# Patient Record
Sex: Male | Born: 1985 | Race: Black or African American | Hispanic: No | Marital: Single | Smoking: Current every day smoker
Health system: Southern US, Community
[De-identification: ages and names within clinical notes are randomized; demographics above are authoritative.]

## PROBLEM LIST (undated history)

## (undated) DIAGNOSIS — F410 Panic disorder [episodic paroxysmal anxiety] without agoraphobia: Secondary | ICD-10-CM

## (undated) DIAGNOSIS — I10 Essential (primary) hypertension: Secondary | ICD-10-CM

## (undated) HISTORY — DX: Panic disorder (episodic paroxysmal anxiety): F41.0

## (undated) HISTORY — DX: Essential (primary) hypertension: I10

---

## 2015-11-22 ENCOUNTER — Emergency Department
Admission: EM | Admit: 2015-11-22 | Discharge: 2015-11-22 | Disposition: A | Discharge status: Home / Self Care | Payer: Self-pay | Attending: Emergency Medicine | Admitting: Emergency Medicine

## 2015-11-22 ENCOUNTER — Emergency Department: Payer: Self-pay

## 2015-11-22 DIAGNOSIS — J069 Acute upper respiratory infection, unspecified: Secondary | ICD-10-CM | POA: Insufficient documentation

## 2015-11-22 LAB — RAPID INFLUENZA A&B ANTIGENS
Influenza A (ARMC): NEGATIVE
Influenza B (ARMC): NEGATIVE

## 2015-11-22 MED ORDER — GUAIFENESIN-CODEINE 100-10 MG/5ML PO SOLN
10.0000 mL | Freq: Once | ORAL | Status: AC
  Administered 2015-11-22: 10 mL via ORAL
  Filled 2015-11-22: qty 10

## 2015-11-22 MED ORDER — IBUPROFEN 800 MG PO TABS: 800.0000 mg | ORAL_TABLET | Freq: Three times a day (TID) | ORAL | Status: DC | PRN

## 2015-11-22 MED ORDER — AZITHROMYCIN 250 MG PO TABS: ORAL_TABLET | ORAL | Status: DC

## 2015-11-22 MED ORDER — IBUPROFEN 800 MG PO TABS
800.0000 mg | ORAL_TABLET | Freq: Once | ORAL | Status: AC
  Administered 2015-11-22: 800 mg via ORAL
  Filled 2015-11-22: qty 1

## 2015-11-22 MED ORDER — AZITHROMYCIN 500 MG PO TABS
500.0000 mg | ORAL_TABLET | Freq: Once | ORAL | Status: AC
  Administered 2015-11-22: 500 mg via ORAL
  Filled 2015-11-22: qty 1

## 2015-11-22 MED ORDER — GUAIFENESIN-CODEINE 100-10 MG/5ML PO SOLN: 10.0000 mL | ORAL | Status: DC | PRN

## 2015-11-22 NOTE — ED Notes (Signed)
Pt reports 2 days of cold symptoms, runny nose and cough

## 2015-11-22 NOTE — Discharge Instructions (Signed)
Upper Respiratory Infection, Adult Most upper respiratory infections (URIs) are a viral infection of the air passages leading to the lungs. A URI affects the nose, throat, and upper air passages. The most common type of URI is nasopharyngitis and is typically referred to as "the common cold." URIs run their course and usually go away on their own. Most of the time, a URI does not require medical attention, but sometimes a bacterial infection in the upper airways can follow a viral infection. This is called a secondary infection. Sinus and middle ear infections are common types of secondary upper respiratory infections. Bacterial pneumonia can also complicate a URI. A URI can worsen asthma and chronic obstructive pulmonary disease (COPD). Sometimes, these complications can require emergency medical care and may be life threatening.  CAUSES Almost all URIs are caused by viruses. A virus is a type of germ and can spread from one person to another.  RISKS FACTORS You may be at risk for a URI if:   You smoke.   You have chronic heart or lung disease.  You have a weakened defense (immune) system.   You are very young or very old.   You have nasal allergies or asthma.  You work in crowded or poorly ventilated areas.  You work in health care facilities or schools. SIGNS AND SYMPTOMS  Symptoms typically develop 2-3 days after you come in contact with a cold virus. Most viral URIs last 7-10 days. However, viral URIs from the influenza virus (flu virus) can last 14-18 days and are typically more severe. Symptoms may include:   Runny or stuffy (congested) nose.   Sneezing.   Cough.   Sore throat.   Headache.   Fatigue.   Fever.   Loss of appetite.   Pain in your forehead, behind your eyes, and over your cheekbones (sinus pain).  Muscle aches.  DIAGNOSIS  Your health care provider may diagnose a URI by:  Physical exam.  Tests to check that your symptoms are not due to  another condition such as:  Strep throat.  Sinusitis.  Pneumonia.  Asthma. TREATMENT  A URI goes away on its own with time. It cannot be cured with medicines, but medicines may be prescribed or recommended to relieve symptoms. Medicines may help:  Reduce your fever.  Reduce your cough.  Relieve nasal congestion. HOME CARE INSTRUCTIONS   Take medicines only as directed by your health care provider.   Gargle warm saltwater or take cough drops to comfort your throat as directed by your health care provider.  Use a warm mist humidifier or inhale steam from a shower to increase air moisture. This may make it easier to breathe.  Drink enough fluid to keep your urine clear or pale yellow.   Eat soups and other clear broths and maintain good nutrition.   Rest as needed.   Return to work when your temperature has returned to normal or as your health care provider advises. You may need to stay home longer to avoid infecting others. You can also use a face mask and careful hand washing to prevent spread of the virus.  Increase the usage of your inhaler if you have asthma.   Do not use any tobacco products, including cigarettes, chewing tobacco, or electronic cigarettes. If you need help quitting, ask your health care provider. PREVENTION  The best way to protect yourself from getting a cold is to practice good hygiene.   Avoid oral or hand contact with people with cold   symptoms.   Wash your hands often if contact occurs.  There is no clear evidence that vitamin C, vitamin E, echinacea, or exercise reduces the chance of developing a cold. However, it is always recommended to get plenty of rest, exercise, and practice good nutrition.  SEEK MEDICAL CARE IF:   You are getting worse rather than better.   Your symptoms are not controlled by medicine.   You have chills.  You have worsening shortness of breath.  You have brown or red mucus.  You have yellow or brown nasal  discharge.  You have pain in your face, especially when you bend forward.  You have a fever.  You have swollen neck glands.  You have pain while swallowing.  You have white areas in the back of your throat. SEEK IMMEDIATE MEDICAL CARE IF:   You have severe or persistent:  Headache.  Ear pain.  Sinus pain.  Chest pain.  You have chronic lung disease and any of the following:  Wheezing.  Prolonged cough.  Coughing up blood.  A change in your usual mucus.  You have a stiff neck.  You have changes in your:  Vision.  Hearing.  Thinking.  Mood. MAKE SURE YOU:   Understand these instructions.  Will watch your condition.  Will get help right away if you are not doing well or get worse.   This information is not intended to replace advice given to you by your health care provider. Make sure you discuss any questions you have with your health care provider.   Document Released: 02/26/2001 Document Revised: 01/17/2015 Document Reviewed: 12/08/2013 Elsevier Interactive Patient Education 2016 [employer]  

## 2015-11-22 NOTE — ED Provider Notes (Signed)
[facility] Emergency Department Provider Note  ____________________________________________  Time seen: Approximately 9:26 PM  I have reviewed the triage vital signs and the nursing notes.   HISTORY  Chief Complaint URI    HPI Kurt Dean is a 30 y.o. male who presents with a 2 day history of fever chills runny nose congestion sore throat and cough. States symptoms started slowly and then came on suddenly. States no relief with over-the-counter medications. Worse when standing up and down with the public. States he works at [employer] and has been around a lot of sick people lately.   No past medical history on file.  There are no active problems to display for this patient.   No past surgical history on file.  Current Outpatient Rx  Name  Route  Sig  Dispense  Refill  . azithromycin (ZITHROMAX Z-PAK) 250 MG tablet      Take 2 tablets (500 mg) on  Day 1,  followed by 1 tablet (250 mg) once daily on Days 2 through 5.   6 each   0   . guaiFENesin-codeine 100-10 MG/5ML syrup   Oral   Take 10 mLs by mouth every 4 (four) hours as needed for cough.   180 mL   0   . ibuprofen (ADVIL,MOTRIN) 800 MG tablet   Oral   Take 1 tablet (800 mg total) by mouth every 8 (eight) hours as needed.   30 tablet   0     Allergies Review of patient's allergies indicates no known allergies.  No family history on file.  Social History Social History  Substance Use Topics  . Smoking status: Not on file  . Smokeless tobacco: Not on file  . Alcohol Use: Not on file    Review of Systems Constitutional: Positive fever and chills Eyes: No visual changes. ENT: Positive sore throat. Cardiovascular: Denies chest pain. Respiratory: Denies shortness of breath. Positive for cough Musculoskeletal: Negative for back pain. Skin: Negative for rash. Neurological: Positive for headaches, negative for focal weakness or numbness.  10-point ROS otherwise  negative.  ____________________________________________   PHYSICAL EXAM:  VITAL SIGNS: ED Triage Vitals  Enc Vitals Group     BP 11/22/15 1956 149/94 mmHg     Pulse Rate 11/22/15 1956 110     Resp 11/22/15 1956 18     Temp 11/22/15 1956 98.5 F (36.9 C)     Temp src --      SpO2 11/22/15 1956 100 %     Weight --      Height --      Head Cir --      Peak Flow --      Pain Score 11/22/15 1956 9     Pain Loc --      Pain Edu? --      Excl. in GC? --     Constitutional: Alert and oriented. Well appearing and in no acute distress. Head: Atraumatic. Nose: No congestion/rhinnorhea. Mouth/Throat: Mucous membranes are moist.  Oropharynx erythematous. Neck: No stridor.   Cardiovascular: Normal rate, regular rhythm. Grossly normal heart sounds.  Good peripheral circulation. Respiratoretal: Normal respiratory effort.  No retractions. Lungs CTAB. Neurologic:  Normal speech and language. No gross focal neurologic deficits are appreciated. No gait instability. Skin:  Skin is warm, dry and intact. No rash noted. Psychiatric: Mood and affect are normal. Speech and behavior are normal.  ____________________________________________   LABS (all labs ordered are listed, but only abnormal results are displayed)  Labs Reviewed  RAPID INFLUENZA A&B ANTIGENS ([facility] ONLY)   ____________________________________________  EKG   ____________________________________________  RADIOLOGY  No acute cardiopulmonary findings. ____________________________________________   PROCEDURES  Procedure(s) performed: None  Critical Care performed: No  ____________________________________________   INITIAL IMPRESSION / ASSESSMENT AND PLAN / ED COURSE  Pertinent labs & imaging results that were available during my care of the patient were reviewed by me and considered in my medical decision making (see chart for details).  Acute upper respiratory infection/pharyngitis. Rx given for Zithromax,  Robitussin-AC and Motrin 800 mg. Patient to follow-up with PCP or return here with any worsening symptomology. She is 48 hours given. ____________________________________________   FINAL CLINICAL IMPRESSION(S) / ED DIAGNOSES  Final diagnoses:  URI, acute     This chart was dictated using voice recognition software/Dragon. Despite best efforts to proofread, errors can occur which can change the meaning. Any change was purely unintentional.   Evangeline Dakinharles M Klarisa Barman, PA-C 11/22/15 2140  Jeanmarie PlantJames A McShane, MD 11/23/15 [phone]

## 2016-12-19 ENCOUNTER — Emergency Department
Admission: EM | Admit: 2016-12-19 | Discharge: 2016-12-19 | Disposition: A | Discharge status: Home / Self Care | Payer: Self-pay | Attending: Emergency Medicine | Admitting: Emergency Medicine

## 2016-12-19 DIAGNOSIS — K4090 Unilateral inguinal hernia, without obstruction or gangrene, not specified as recurrent: Secondary | ICD-10-CM | POA: Insufficient documentation

## 2016-12-19 NOTE — ED Triage Notes (Signed)
Pt in via POV with complaints of inguinal hernia to left groin.  Pt reports hx of same but with new onset "pressure and bulging" within the last week while working.  Pt denies any pain at this time.  Pt ambulatory to triage room without any difficulty.

## 2016-12-19 NOTE — Discharge Instructions (Signed)
Call and make an appointment with the surgeon listed on your discharge papers.

## 2016-12-19 NOTE — ED Notes (Signed)
See triage note  States he was dx'd with hernia couple of years ago  But was told at that time he did not need surgery ..but over the past few weeks he has had increased pain to left groin area  Denies any pain at present  Ambulates well to treatment room

## 2016-12-19 NOTE — ED Provider Notes (Signed)
[facility] Emergency Department Provider Note  ____________________________________________   None    (approximate)  I have reviewed the triage vital signs and the nursing notes.   HISTORY  Chief Complaint Inguinal Hernia    HPI Kurt Dean is a 31 y.o. male is here with complaint of left inguinal hernia. Patient states that he has been incarcerated twice and both times his  doctor has told him he has an inguinal hernia.  This is been ongoing for the last 5 years or longer.   He has not followed up with a surgeon at this time. Patient states that he has more discomfort with lifting then with standing. Patient states that the hernia is still continues to slide in and out. He denies any pain or discomfort at this time. There is been no nausea or vomiting.  No past medical history on file.  There are no active problems to display for this patient.   No past surgical history on file.  Prior to Admission medications   Not on File    Allergies Patient has no known allergies.  No family history on file.  Social History Social History  Substance Use Topics  . Smoking status: Not on file  . Smokeless tobacco: Not on file  . Alcohol use Not on file    Review of Systems Constitutional: No fever/chills Eyes: No visual changes. ENT: No sore throat. Cardiovascular: Denies chest pain. Respiratory: Denies shortness of breath. Gastrointestinal: No abdominal pain.  No nausea, no vomiting.   Genitourinary: History of left inguinal hernia. Musculoskeletal: Negative for back pain. Skin: Negative for rash. Neurological: Negative for headaches, focal weakness or numbness.  10-point ROS otherwise negative.  ____________________________________________   PHYSICAL EXAM:  VITAL SIGNS: ED Triage Vitals [12/19/16 0727]  Enc Vitals Group     BP (!) 142/90     Pulse Rate 80     Resp 18     Temp 97.9 F (36.6 C)     Temp Source Oral     SpO2  100 %     Weight 145 lb (65.8 kg)     Height  (1.753 m)     Head Circumference      Peak Flow      Pain Score      Pain Loc      Pain Edu?      Excl. in GC?     Constitutional: Alert and oriented. Well appearing and in no acute distress. Eyes: Conjunctivae are normal. PERRL. EOMI. Head: Atraumatic. Nose: No congestion/rhinnorhea. Neck: No stridor.   Cardiovascular: Normal rate, regular rhythm. Grossly normal heart sounds.  Good peripheral circulation. Respiratory: Normal respiratory effort.  No retractions. Lungs CTAB. Gastrointestinal: Soft and nontender. No distention. Genitourinary:  Patient positive for left inguinal hernia. Hernia is reducible. Nontender. Musculoskeletal: No lower extremity tenderness nor edema.  No joint effusions. Neurologic:  Normal speech and language. No gross focal neurologic deficits are appreciated. No gait instability. Skin:  Skin is warm, dry and intact. No rash noted. Psychiatric: Mood and affect are normal. Speech and behavior are normal.  ____________________________________________   LABS (all labs ordered are listed, but only abnormal results are displayed)  Labs Reviewed - No data to display   PROCEDURES  Procedure(s) performed: None  Procedures  Critical Care performed: No  ____________________________________________   INITIAL IMPRESSION / ASSESSMENT AND PLAN / ED COURSE  Pertinent labs & imaging results that were available during my care of the patient  were reviewed by me and considered in my medical decision making (see chart for details).  Patient was given him the name of the surgeon on call today. He is call make an appointment for surgical repair. He is given a note for today explaining where he was.      ____________________________________________   FINAL CLINICAL IMPRESSION(S) / ED DIAGNOSES  Final diagnoses:  Left inguinal hernia      NEW MEDICATIONS STARTED DURING THIS VISIT:  Discharge  Medication List as of 12/19/2016  8:28 AM       Note:  This document was prepared using Dragon voice recognition software and may include unintentional dictation errors.    Tommi Rumps, PA-C 12/19/16 1127    Merrily Brittle, MD 12/19/16 [phone]

## 2017-08-15 ENCOUNTER — Emergency Department: Payer: Self-pay

## 2017-08-15 ENCOUNTER — Emergency Department
Admission: EM | Admit: 2017-08-15 | Discharge: 2017-08-15 | Disposition: A | Discharge status: Home / Self Care | Payer: Self-pay | Attending: Emergency Medicine | Admitting: Emergency Medicine

## 2017-08-15 DIAGNOSIS — B070 Plantar wart: Secondary | ICD-10-CM | POA: Insufficient documentation

## 2017-08-15 DIAGNOSIS — F17210 Nicotine dependence, cigarettes, uncomplicated: Secondary | ICD-10-CM | POA: Insufficient documentation

## 2017-08-15 MED ORDER — SALICYLIC ACID 27.5 % EX LIQD: 1.0000 "application " | Freq: Two times a day (BID) | CUTANEOUS | 2 refills | Status: AC

## 2017-08-15 MED ORDER — MELOXICAM 15 MG PO TABS: 15.0000 mg | ORAL_TABLET | Freq: Every day | ORAL | 0 refills | Status: DC

## 2017-08-15 NOTE — ED Triage Notes (Signed)
Pt in via POV with complaints of pain to right great toe x months, denies any recent injury, states, "I think its from work and wearing steel toe boots."  Pt ambulatory to triage without difficulty, NAD noted at this time.

## 2017-08-15 NOTE — ED Notes (Signed)
Triage performed per this RN. 

## 2017-08-15 NOTE — ED Provider Notes (Signed)
[facility] Emergency Department Provider Note  ____________________________________________  Time seen: Approximately 5:37 PM  I have reviewed the triage vital signs and the nursing notes.   HISTORY  Chief Complaint Foot Pain    HPI Kurt Dean is a 31 y.o. male who presents emergency department complaining of pain to the right great toe and right foot.  Patient reports that he wears steel toed boots for his job.  Patient reports that his toe rubs against the steel toe aspect and sometimes gets some issues.  Over the past 5 months, patient has noticed an area to the plantar aspect of the great toe over the pad that has changed in appearance.  Patient reports that initially it looked like a blister versus callus.  He has been shaving it with a razor blade.  He now notes a "core" to the area.  Over the past week to 2 weeks, patient has developed sharp intermittent pains radiating from his toe into his foot.  Patient denies any recent trauma.  Patient reports that the area has been growing in size.  He denies any gross erythema.  No drainage from the site.  No other complaints at this time.  Over the counter medications have not provided any relief.  History reviewed. No pertinent past medical history.  There are no active problems to display for this patient.   History reviewed. No pertinent surgical history.  Prior to Admission medications   Medication Sig Start Date End Date Taking? Authorizing Provider  meloxicam (MOBIC) 15 MG tablet Take 1 tablet (15 mg total) by mouth daily. 08/15/17   Cuthriell, Delorise RoyalsJonathan D, PA-C  Salicylic Acid 27.5 % LIQD Apply 1 application topically 2 (two) times daily. Apply for 4-6 weeks until area shows complete resolution 08/15/17 09/26/17  Cuthriell, Delorise RoyalsJonathan D, PA-C    Allergies Patient has no known allergies.  No family history on file.  Social History Social History   Tobacco Use  . Smoking status: Current Every  Day Smoker    Packs/day: 0.50    Types: Cigarettes  . Smokeless tobacco: Never Used  Substance Use Topics  . Alcohol use: Not on file  . Drug use: No     Review of Systems  Constitutional: No fever/chills Eyes: No visual changes. Cardiovascular: no chest pain. Respiratory: no cough. No SOB. Gastrointestinal: No abdominal pain.  No nausea, no vomiting.  Musculoskeletal: Pain in the right great toe and foot. Skin: Negative for rash, abrasions, lacerations, ecchymosis. Neurological: Negative for headaches, focal weakness or numbness. 10-point ROS otherwise negative.  ____________________________________________   PHYSICAL EXAM:  VITAL SIGNS: ED Triage Vitals  Enc Vitals Group     BP 08/15/17 1549 (!) 163/102     Pulse Rate 08/15/17 1549 83     Resp 08/15/17 1549 16     Temp 08/15/17 1549 98.2 F (36.8 C)     Temp Source 08/15/17 1549 Oral     SpO2 08/15/17 1549 100 %     Weight 08/15/17 1549 145 lb (65.8 kg)     Height 08/15/17 1549 5\' 9"  (1.753 m)     Head Circumference --      Peak Flow --      Pain Score 08/15/17 1548 6     Pain Loc --      Pain Edu? --      Excl. in GC? --      Constitutional: Alert and oriented. Well appearing and in no acute distress. Eyes: Conjunctivae are  normal. PERRL. EOMI. Head: Atraumatic. Neck: No stridor.    Cardiovascular: Normal rate, regular rhythm. Normal S1 and S2.  Good peripheral circulation. Respiratory: Normal respiratory effort without tachypnea or retractions. Lungs CTAB. Good air entry to the bases with no decreased or absent breath sounds. Musculoskeletal: Full range of motion to all extremities. No gross deformities appreciated.  No gross deformities noted to the right foot.  Patient has skin changes to the plantar aspect of the great toe.  Central core with surrounding hypertrophy of the skin consistent with plantar wart.  Area is tender to palpation.  Patient is nontender palpation over the foot.  Dorsalis pedis pulse  intact.  Sensation and cap refill intact all 5 digits. Neurologic:  Normal speech and language. No gross focal neurologic deficits are appreciated.  Skin:  Skin is warm, dry and intact. No rash noted. Psychiatric: Mood and affect are normal. Speech and behavior are normal. Patient exhibits appropriate insight and judgement.   ____________________________________________   LABS (all labs ordered are listed, but only abnormal results are displayed)  Labs Reviewed - No data to display ____________________________________________  EKG   ____________________________________________  RADIOLOGY Festus Barren Cuthriell, personally viewed and evaluated these images (plain radiographs) as part of my medical decision making, as well as reviewing the written report by the radiologist.  Dg Foot Complete Right  Result Date: 08/15/2017 CLINICAL DATA:  Callus on the bottom of the foot, enlarging. EXAM: RIGHT FOOT COMPLETE - 3+ VIEW COMPARISON:  None. FINDINGS: There is no evidence of fracture or dislocation. There is no evidence of arthropathy or other focal bone abnormality. Increased soft tissue swelling about the great toe. IMPRESSION: No acute osseous findings.  Soft tissue swelling. Electronically Signed   By: Elsie Stain M.D.   On: 08/15/2017 16:38    ____________________________________________    PROCEDURES  Procedure(s) performed:    Procedures    Medications - No data to display   ____________________________________________   INITIAL IMPRESSION / ASSESSMENT AND PLAN / ED COURSE  Pertinent labs & imaging results that were available during my care of the patient were reviewed by me and considered in my medical decision making (see chart for details).  Review of the  [place] was performed in accordance of the [facility] prior to dispensing any controlled drugs.     Patient's diagnosis is consistent with plantar wart to her the right foot.  Patient presents with pain and  skin changes to the first digit of the right foot.  Differential included gout versus wart versus cellulitis versus osteomyelitis.  X-ray was obtained to ensure no osseous changes.  X-ray reveals no acute osseous ab normality.  Patient will be discharged home with prescriptions for salicylic acid topical and meloxicam. Patient is to follow up with podiatry as needed or otherwise directed. Patient is given ED precautions to return to the ED for any worsening or new symptoms.     ____________________________________________  FINAL CLINICAL IMPRESSION(S) / ED DIAGNOSES  Final diagnoses:  Plantar wart      NEW MEDICATIONS STARTED DURING THIS VISIT:  ED Discharge Orders        Ordered    Salicylic Acid 27.5 % LIQD  2 times daily     08/15/17 1748    meloxicam (MOBIC) 15 MG tablet  Daily     08/15/17 1748          This chart was dictated using voice recognition software/Dragon. Despite best efforts to proofread, errors can occur which can change  the meaning. Any change was purely unintentional.    Racheal PatchesCuthriell, Jonathan D, PA-C 08/15/17 1757    Arnaldo NatalMalinda, Paul F, MD 08/15/17 [phone]

## 2017-10-22 ENCOUNTER — Ambulatory Visit (INDEPENDENT_AMBULATORY_CARE_PROVIDER_SITE_OTHER): Payer: Self-pay | Admitting: Family Medicine

## 2017-10-22 ENCOUNTER — Encounter: Payer: Self-pay | Admitting: Family Medicine

## 2017-10-22 VITALS — BP 138/79 | HR 80 | Temp 97.8°F | Ht 68.75 in | Wt 143.1 lb

## 2017-10-22 DIAGNOSIS — K4090 Unilateral inguinal hernia, without obstruction or gangrene, not specified as recurrent: Secondary | ICD-10-CM

## 2017-10-22 DIAGNOSIS — Z7689 Persons encountering health services in other specified circumstances: Secondary | ICD-10-CM

## 2017-10-22 DIAGNOSIS — B070 Plantar wart: Secondary | ICD-10-CM

## 2017-10-22 NOTE — Progress Notes (Signed)
BP 138/79 (BP Location: Right Arm, Patient Position: Sitting, Cuff Size: Normal)   Pulse 80   Temp 97.8 F (36.6 C) (Oral)   Ht 5' 8.75" (1.746 m)   Wt 143 lb 1.6 oz (64.9 kg)   SpO2 100%   BMI 21.29 kg/m    Subjective:    Patient ID: Kurt [facility], male    DOB: September 24, 1985, 32 y.o.   MRN: [id number]  HPI: Kurt Gaussatrick Lee Boze is a 32 y.o. male  Chief Complaint  Patient presents with  . Establish Care  . Foot Pain    Right, ongoing for the last few months.   Pt here today to establish care.   Main concern today is right foot pain at big toe x 1 year. Has tried soaks, shaving the dead skin, ointments. Sxs worsening and now having sharp pains from that area across entire foot. Found out it was a plantar wart, started using topical acids and wart pads. Nothing helped and kept getting worse, went to ER in Nov and was told to use meloxicam and stronger salycylic acid. Has continued OTC care with no relief.   Diagnosed with a hernia in left groin while incarcerated. Reducible, only painful during heavy lifting at work. No redness, fevers, severe pain. Does not bother him much at all.   Long fhx of HTN and strokes on mother's side. Had a lot of stressors in 2009 time frame and was having intermittent issues with HAs and visual disturbances from elevated BPs. Has not had issues since.   Smoking cigarettes - about 3-4 cigarettes per day. Trying to reduce slowly.   Last CPE was around 2015 while incarcerated.   Relevant past medical, surgical, family and social history reviewed and updated as indicated. Interim medical history since our last visit reviewed. Allergies and medications reviewed and updated.  Review of Systems  Constitutional: Negative.   HENT: Negative.   Respiratory: Negative.   Cardiovascular: Negative.   Gastrointestinal:       Left inguinal hernia  Genitourinary: Negative.   Musculoskeletal: Negative.   Skin:       Significant plantar wart    Neurological: Negative.   Psychiatric/Behavioral: Negative.    Per HPI unless specifically indicated above     Objective:    BP 138/79 (BP Location: Right Arm, Patient Position: Sitting, Cuff Size: Normal)   Pulse 80   Temp 97.8 F (36.6 C) (Oral)   Ht 5' 8.75" (1.746 m)   Wt 143 lb 1.6 oz (64.9 kg)   SpO2 100%   BMI 21.29 kg/m   Wt Readings from Last 3 Encounters:  10/22/17 143 lb 1.6 oz (64.9 kg)  08/15/17 145 lb (65.8 kg)  12/19/16 145 lb (65.8 kg)    Physical Exam  Constitutional: He is oriented to person, place, and time. He appears well-developed and well-nourished. No distress.  HENT:  Head: Atraumatic.  Eyes: Conjunctivae are normal. Pupils are equal, round, and reactive to light.  Neck: Normal range of motion. Neck supple.  Cardiovascular: Normal rate and normal heart sounds.  Pulmonary/Chest: Effort normal and breath sounds normal. No respiratory distress.  Abdominal: Soft. Bowel sounds are normal. He exhibits no distension. There is no tenderness. A hernia is present. Hernia confirmed positive in the left inguinal area (reducible).  Musculoskeletal: Normal range of motion.  Neurological: He is alert and oriented to person, place, and time.  Skin: Skin is warm and dry.  Significant callous and plantar wart of right great toe  Psychiatric: He has a normal mood and affect. His behavior is normal.  Nursing note and vitals reviewed.  Results for orders placed or performed during the hospital encounter of 11/22/15  Rapid Influenza A&B Antigens ([facility] only)  Result Value Ref Range   Influenza A ([facility]) NEGATIVE NEGATIVE   Influenza B ([facility]) NEGATIVE NEGATIVE      Assessment & Plan:   Problem List Items Addressed This Visit    None    Visit Diagnoses    Plantar wart of right foot    -  Primary   Podiatry referral generated given extent and OTC tx failure. Continue soaks and callous management at home in meantime. OTC pain relievers prn   Relevant Orders    Ambulatory referral to Podiatry   Encounter to establish care       Left inguinal hernia       Dx'ed while incarcerated. Minimally bothersome. Warning precautions reviewed at length, pt will let us know when he's ready for surgical consult       Follow up plan: Return for CPE.

## 2017-10-22 NOTE — Patient Instructions (Signed)
Follow up for CPE 

## 2017-11-07 ENCOUNTER — Ambulatory Visit (INDEPENDENT_AMBULATORY_CARE_PROVIDER_SITE_OTHER): Payer: BLUE CROSS/BLUE SHIELD | Admitting: Podiatry

## 2017-11-07 ENCOUNTER — Encounter: Payer: Self-pay | Admitting: Podiatry

## 2017-11-07 DIAGNOSIS — B070 Plantar wart: Secondary | ICD-10-CM | POA: Diagnosis not present

## 2017-11-07 MED ORDER — TRAMADOL HCL 50 MG PO TABS: 50.0000 mg | ORAL_TABLET | Freq: Three times a day (TID) | ORAL | 0 refills | Status: DC | PRN

## 2017-11-07 NOTE — Progress Notes (Signed)
   Subjective:    Patient ID: Kurt Dean, male    DOB: [birth year], 32 y.o.   MRN: [id number]  HPI    Review of Systems  All other systems reviewed and are negative.      Objective:   Physical Exam        Assessment & Plan:

## 2017-11-09 NOTE — Progress Notes (Signed)
   Subjective: 32 year old male presenting today as a new patient with a chief complaint of a skin lesion to the right great toe that has been present for about 9-12 months. He reports associated pain with pressure. He has not done anything to treat the area. Patient is here for further evaluation and treatment.   Past Medical History:  Diagnosis Date  . Hypertension   . Panic attacks     Objective: Physical Exam General: The patient is alert and oriented x3 in no acute distress.  Dermatology: Hyperkeratotic skin lesion noted to the plantar aspect of the right foot approximately 1 cm in diameter. Pinpoint bleeding noted upon debridement. Skin is warm, dry and supple bilateral lower extremities. Negative for open lesions or macerations.  Vascular: Palpable pedal pulses bilaterally. No edema or erythema noted. Capillary refill within normal limits.  Neurological: Epicritic and protective threshold grossly intact bilaterally.   Musculoskeletal Exam: Pain on palpation to the note skin lesion.  Range of motion within normal limits to all pedal and ankle joints bilateral. Muscle strength 5/5 in all groups bilateral.   Assessment: #1 plantar wart right great toe  Plan of Care:  #1 Patient was evaluated. #2 Excisional debridement of the plantar wart lesion was performed using a chisel blade. Cantharone was applied and the lesion was dressed with a dry sterile dressing. #3 Prescription for tramadol provided to patient. #4 patient is to return to clinic in 2 weeks.  Works in [employer].    Felecia ShellingBrent M. Genevra Orne, DPM [facility]  Dr. Felecia ShellingBrent M. Dashana Guizar, DPM    [address]                                        [place], [place] [address]                Office [phone]) [phone]  Fax [phone]) [phone]

## 2017-11-21 ENCOUNTER — Encounter: Payer: Self-pay | Admitting: Podiatry

## 2017-11-21 ENCOUNTER — Ambulatory Visit (INDEPENDENT_AMBULATORY_CARE_PROVIDER_SITE_OTHER): Payer: BLUE CROSS/BLUE SHIELD | Admitting: Podiatry

## 2017-11-21 DIAGNOSIS — B070 Plantar wart: Secondary | ICD-10-CM | POA: Diagnosis not present

## 2017-11-24 NOTE — Progress Notes (Signed)
   HPI:   Past Medical History:  Diagnosis Date  . Hypertension   . Panic attacks      Physical Exam: General: The patient is alert and oriented x3 in no acute distress.  Dermatology: Hyperkeratotic callus lesion noted to the right great toe. Skin is warm, dry and supple bilateral lower extremities. Negative for open lesions or macerations.  Vascular: Palpable pedal pulses bilaterally. No edema or erythema noted. Capillary refill within normal limits.  Neurological: Epicritic and protective threshold grossly intact bilaterally.   Musculoskeletal Exam: Range of motion within normal limits to all pedal and ankle joints bilateral. Muscle strength 5/5 in all groups bilateral.    Assessment: - right great toe wart - resolved   Plan of Care:  - Patient evaluated.  - Excisional debridement of keratoic lesion using a chisel blade was performed without incident.  - Dressed with light dressing. - Return to clinic as needed.     Felecia ShellingBrent M. Evans, DPM [facility]  Dr. Felecia ShellingBrent M. Evans, DPM    [address]                                        [place], [place] [address]                Office [phone]) [phone]  Fax [phone](336) [phone]

## 2017-12-09 ENCOUNTER — Encounter: Payer: Self-pay | Admitting: Family Medicine

## 2018-04-06 IMAGING — DX DG FOOT COMPLETE 3+V*R*
3 series · 3 of 3 positions shown · non-contrast
Comparison: None.

CLINICAL DATA: Callus on the bottom of the foot, enlarging.

EXAM:
RIGHT FOOT COMPLETE - 3+ VIEW

[foot ap]
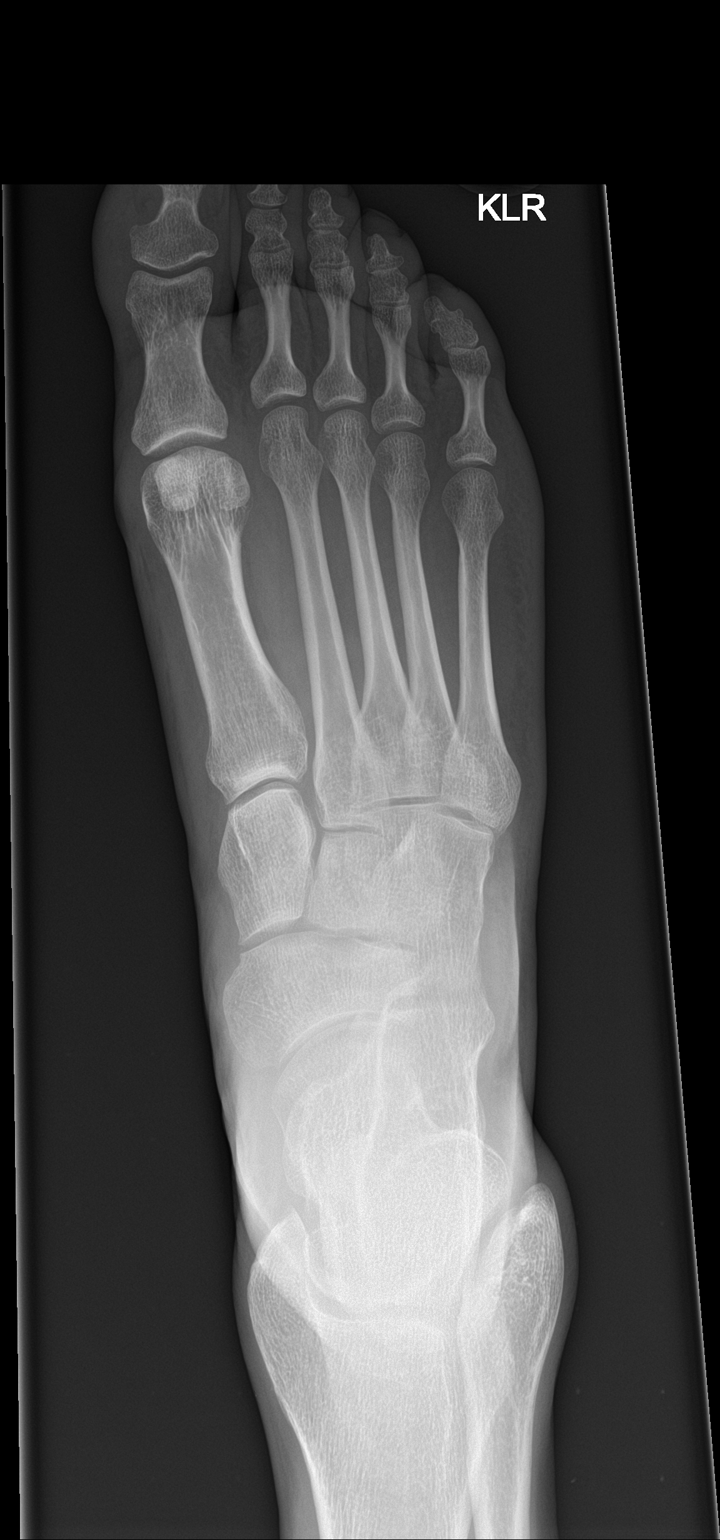

[foot obl]
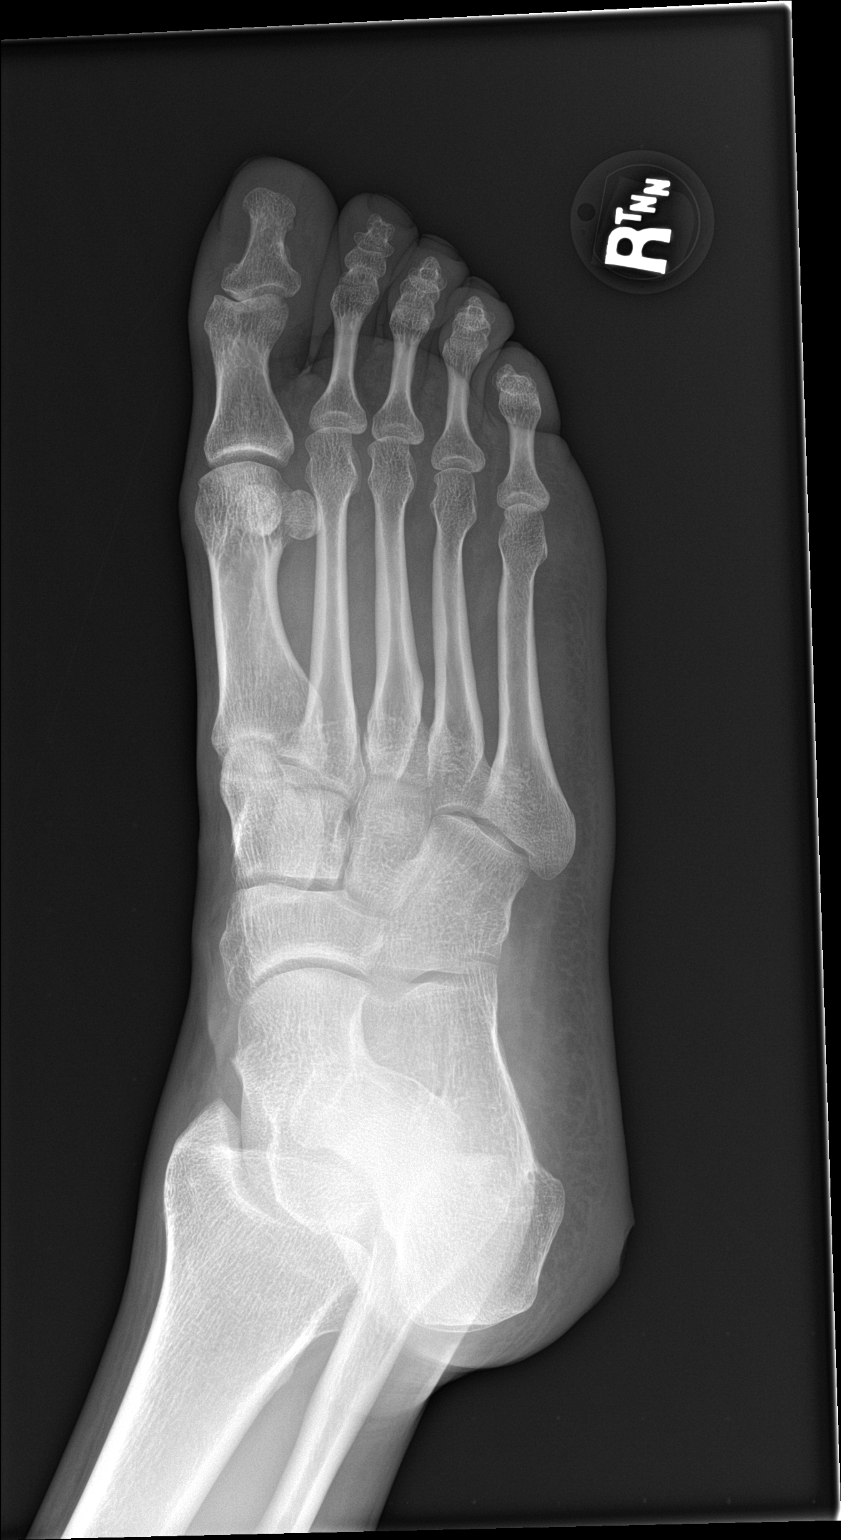

[foot lat]
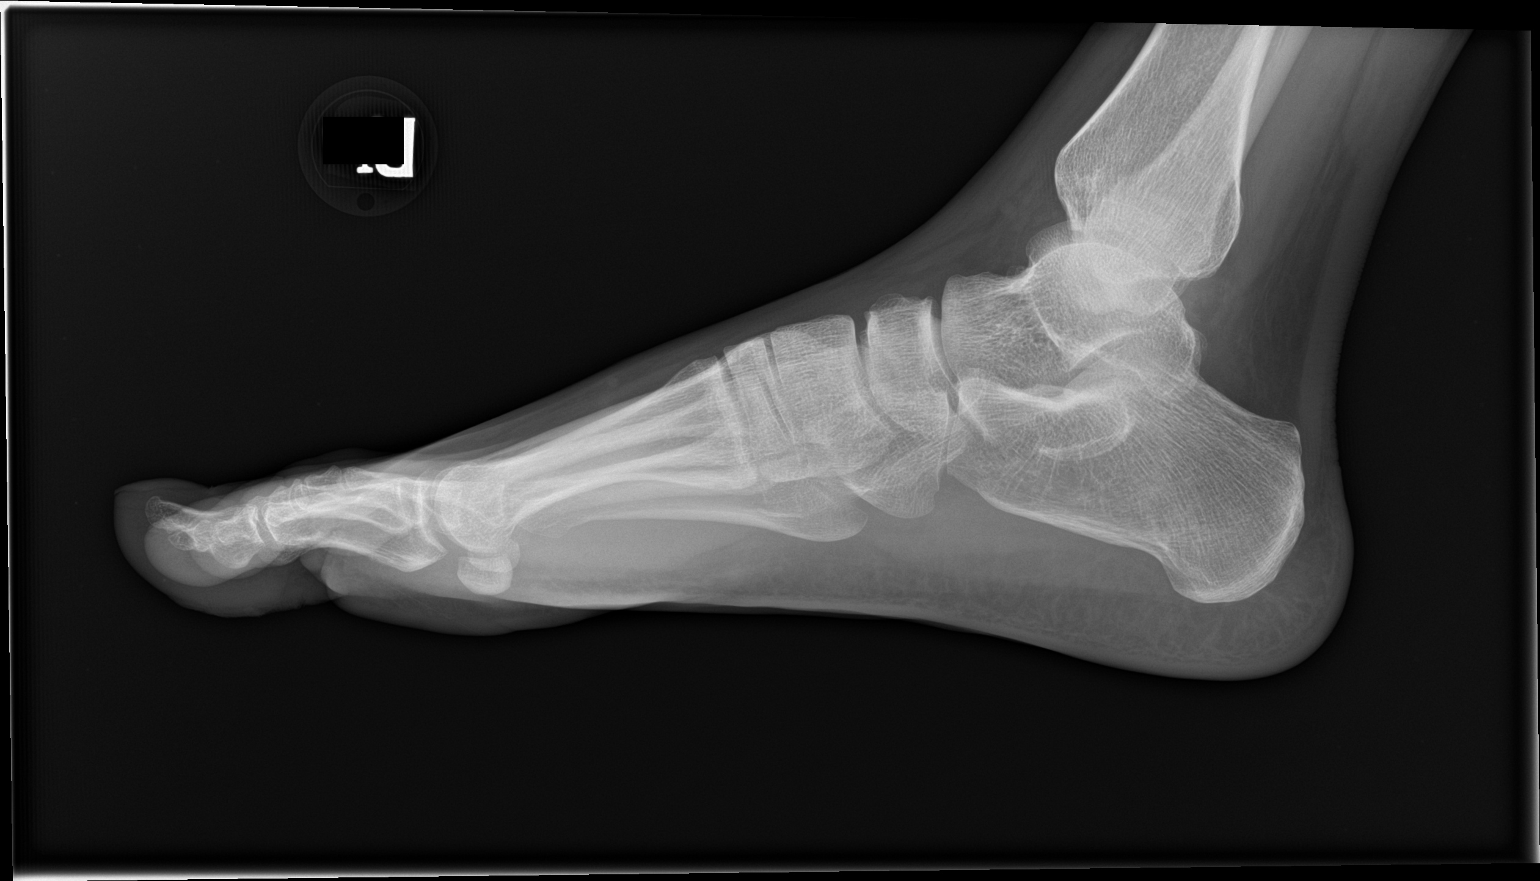

[3 of 3 positions shown; findings below may reference images not displayed]

FINDINGS: There is no evidence of fracture or dislocation. There is no
evidence of arthropathy or other focal bone abnormality. Increased
soft tissue swelling about the great toe.
IMPRESSION: No acute osseous findings.  Soft tissue swelling.

## 2019-07-18 ENCOUNTER — Encounter: Payer: Self-pay | Admitting: Emergency Medicine

## 2019-07-18 ENCOUNTER — Emergency Department
Admission: EM | Admit: 2019-07-18 | Discharge: 2019-07-18 | Disposition: A | Discharge status: Home / Self Care | Payer: Self-pay | Attending: Emergency Medicine | Admitting: Emergency Medicine

## 2019-07-18 ENCOUNTER — Emergency Department: Payer: Self-pay

## 2019-07-18 DIAGNOSIS — Y999 Unspecified external cause status: Secondary | ICD-10-CM | POA: Diagnosis not present

## 2019-07-18 DIAGNOSIS — S60221A Contusion of right hand, initial encounter: Secondary | ICD-10-CM | POA: Insufficient documentation

## 2019-07-18 DIAGNOSIS — F17210 Nicotine dependence, cigarettes, uncomplicated: Secondary | ICD-10-CM | POA: Diagnosis not present

## 2019-07-18 DIAGNOSIS — S6991XA Unspecified injury of right wrist, hand and finger(s), initial encounter: Secondary | ICD-10-CM | POA: Diagnosis present

## 2019-07-18 DIAGNOSIS — Z791 Long term (current) use of non-steroidal anti-inflammatories (NSAID): Secondary | ICD-10-CM | POA: Insufficient documentation

## 2019-07-18 DIAGNOSIS — I10 Essential (primary) hypertension: Secondary | ICD-10-CM | POA: Diagnosis not present

## 2019-07-18 DIAGNOSIS — W25XXXA Contact with sharp glass, initial encounter: Secondary | ICD-10-CM | POA: Insufficient documentation

## 2019-07-18 DIAGNOSIS — S60511A Abrasion of right hand, initial encounter: Secondary | ICD-10-CM | POA: Insufficient documentation

## 2019-07-18 DIAGNOSIS — Y939 Activity, unspecified: Secondary | ICD-10-CM | POA: Diagnosis not present

## 2019-07-18 DIAGNOSIS — Y929 Unspecified place or not applicable: Secondary | ICD-10-CM | POA: Diagnosis not present

## 2019-07-18 MED ORDER — BACITRACIN-NEOMYCIN-POLYMYXIN 400-5-5000 EX OINT
TOPICAL_OINTMENT | CUTANEOUS | Status: AC
  Filled 2019-07-18: qty 1
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
## 2019-07-18 NOTE — ED Notes (Signed)
Pt verbalizes understanding of d/c instructions and follow up. 

## 2019-09-22 ENCOUNTER — Other Ambulatory Visit: Payer: Self-pay

## 2019-09-22 ENCOUNTER — Emergency Department (HOSPITAL_COMMUNITY)
Admission: EM | Admit: 2019-09-22 | Discharge: 2019-09-22 | Disposition: A | Discharge status: Home / Self Care | Payer: HRSA Program | Attending: Emergency Medicine | Admitting: Emergency Medicine

## 2019-09-22 DIAGNOSIS — R05 Cough: Secondary | ICD-10-CM | POA: Diagnosis present

## 2019-09-22 DIAGNOSIS — F17210 Nicotine dependence, cigarettes, uncomplicated: Secondary | ICD-10-CM | POA: Diagnosis not present

## 2019-09-22 DIAGNOSIS — Z79899 Other long term (current) drug therapy: Secondary | ICD-10-CM | POA: Diagnosis not present

## 2019-09-22 DIAGNOSIS — I10 Essential (primary) hypertension: Secondary | ICD-10-CM | POA: Diagnosis not present

## 2019-09-22 DIAGNOSIS — R070 Pain in throat: Secondary | ICD-10-CM | POA: Insufficient documentation

## 2019-09-22 DIAGNOSIS — Z20822 Contact with and (suspected) exposure to covid-19: Secondary | ICD-10-CM

## 2019-09-22 LAB — POC SARS CORONAVIRUS 2 AG -  ED: SARS Coronavirus 2 Ag: NEGATIVE

## 2019-09-22 MED ORDER — GUAIFENESIN 200 MG PO TABS: 200.0000 mg | ORAL_TABLET | ORAL | 0 refills | Status: DC | PRN

## 2019-09-22 NOTE — ED Triage Notes (Signed)
Onset 2 weeks cough, sneezing, runny nose, shortness of breath.  Pt talking in complete sentences, NAD at triage.  Nephew tested COVID + 2 days ago, close contact.

## 2019-09-22 NOTE — ED Notes (Signed)
RN went over dc paperwork with pt who verbalized understanding. Pt alert and no distress noted when ambulated to exit.

## 2019-09-22 NOTE — ED Provider Notes (Signed)
[facility] Provider Note   CSN: [id number] Arrival date & time: 09/22/19  1710     History Chief Complaint  Patient presents with  . Cough    Kurt Dean is a 34 y.o. male.  The history is provided by the patient. No language interpreter was used.  Cough    34 year old male presenting with concern of COVID-19.  Patient report for the past 1 to 2 weeks he has had sinus congestion, throat irritation, occasional nonproductive cough, and feeling a little bit under the weather.  He was notified that his nephew who he spent a moderate amount of time with has been tested positive for COVID-19 2 days ago.  Would like to be tested.  He denies any significant fever, no shortness of breath, no loss of taste or smell, no nausea vomiting or diarrhea.  He does work at [employer] and comes in contact with a lot of people.  He is a smoker, with history of hypertension.  He denies any specific treatment tried at home.  Past Medical History:  Diagnosis Date  . Hypertension   . Panic attacks     There are no problems to display for this patient.   No past surgical history on file.     Family History  Problem Relation Age of Onset  . Hypertension Mother   . Stroke Mother   . Hypertension Father   . Cancer Father   . Hypertension Sister   . Depression Sister   . Stroke Maternal Grandmother   . Cancer Paternal Grandmother     Social History   Tobacco Use  . Smoking status: Current Every Day Smoker    Packs/day: 0.50    Types: Cigarettes  . Smokeless tobacco: Never Used  Substance Use Topics  . Alcohol use: No  . Drug use: No    Home Medications Prior to Admission medications   Medication Sig Start Date End Date Taking? Authorizing Provider  meloxicam (MOBIC) 15 MG tablet Take 15 mg by mouth daily.    [provider]  traMADol (ULTRAM) 50 MG tablet Take 1 tablet (50 mg total) by mouth every 8 (eight) hours as needed.  11/07/17   Felecia Shelling, DPM    Allergies    Patient has no known allergies.  Review of Systems   Review of Systems  Respiratory: Positive for cough.   All other systems reviewed and are negative.   Physical Exam Updated Vital Signs BP (!) 159/113 (BP Location: Right Arm)   Pulse (!) 138   Temp 98.3 F (36.8 C) (Oral)   Resp 10   SpO2 95%   Physical Exam Vitals and nursing note reviewed.  Constitutional:      General: He is not in acute distress.    Appearance: He is well-developed.  HENT:     Head: Atraumatic.  Eyes:     Conjunctiva/sclera: Conjunctivae normal.  Cardiovascular:     Rate and Rhythm: Normal rate and regular rhythm.  Pulmonary:     Effort: Pulmonary effort is normal.     Breath sounds: Normal breath sounds.  Abdominal:     Palpations: Abdomen is soft.     Tenderness: There is no abdominal tenderness.  Musculoskeletal:     Cervical back: Neck supple.  Skin:    Findings: No rash.  Neurological:     Mental Status: He is alert and oriented to person, place, and time.  Psychiatric:  Mood and Affect: Mood normal.     ED Results / Procedures / Treatments   Labs (all labs ordered are listed, but only abnormal results are displayed) Labs Reviewed  SARS CORONAVIRUS 2 (TAT 6-24 HRS)  POC SARS CORONAVIRUS 2 AG -  ED    EKG None  Radiology No results found.  Procedures Procedures (including critical care time)  Medications Ordered in ED Medications - No data to display  ED Course  I have reviewed the triage vital signs and the nursing notes.  Pertinent labs & imaging results that were available during my care of the patient were reviewed by me and considered in my medical decision making (see chart for details).    MDM Rules/Calculators/A&P                      BP (!) 142/99 (BP Location: Left Arm)   Pulse 100   Temp 98.6 F (37 C) (Oral)   Resp 20   SpO2 97%   Final Clinical Impression(s) / ED Diagnoses Final diagnoses:   Contact with and (suspected) exposure to covid-19    Rx / DC Orders ED Discharge Orders         Ordered    guaiFENesin 200 MG tablet  Every 4 hours PRN     09/22/19 2214         10:12 PM Patient here with cold symptoms, recent exposure to family member positive for COVID-19.  He is well-appearing, no hypoxia, afebrile, speaking in complete sentences and nontoxic in appearance.  Initial rapid Covid test is negative, COVID-19 PCR test obtained and sent.  Patient is otherwise stable to be discharged.  Will provide symptomatic treatment, recommend self quarantine in the meantime while waiting for lab results.  Return precaution discussed.  Work note provided.  Kurt Dean was evaluated in Emergency Department on 09/22/2019 for the symptoms described in the history of present illness. He was evaluated in the context of the global COVID-19 pandemic, which necessitated consideration that the patient might be at risk for infection with the SARS-CoV-2 virus that causes COVID-19. Institutional protocols and algorithms that pertain to the evaluation of patients at risk for COVID-19 are in a state of rapid change based on information released by regulatory bodies including the CDC and federal and state organizations. These policies and algorithms were followed during the patient's care in the ED.    Kurt Moras, PA-C 09/22/19 2215    Kurt Skye, MD 09/29/19 [phone]

## 2019-09-23 LAB — SARS CORONAVIRUS 2 (TAT 6-24 HRS): SARS Coronavirus 2: NEGATIVE

## 2021-07-16 ENCOUNTER — Emergency Department: Admission: EM | Admit: 2021-07-16 | Discharge: 2021-07-16 | Discharge status: Against Medical Advice | Payer: 59

## 2021-07-24 ENCOUNTER — Ambulatory Visit
Admission: EM | Admit: 2021-07-24 | Discharge: 2021-07-24 | Disposition: A | Discharge status: Home / Self Care | Payer: 59

## 2021-07-24 ENCOUNTER — Other Ambulatory Visit: Payer: Self-pay

## 2021-09-15 ENCOUNTER — Emergency Department: Payer: Self-pay

## 2021-09-15 ENCOUNTER — Emergency Department
Admission: EM | Admit: 2021-09-15 | Discharge: 2021-09-15 | Disposition: A | Discharge status: Home / Self Care | Payer: Self-pay | Attending: Emergency Medicine | Admitting: Emergency Medicine

## 2021-09-15 ENCOUNTER — Other Ambulatory Visit: Payer: Self-pay

## 2021-09-15 DIAGNOSIS — S02401A Maxillary fracture, unspecified, initial encounter for closed fracture: Secondary | ICD-10-CM | POA: Insufficient documentation

## 2021-09-15 DIAGNOSIS — S0181XA Laceration without foreign body of other part of head, initial encounter: Secondary | ICD-10-CM | POA: Insufficient documentation

## 2021-09-15 DIAGNOSIS — I10 Essential (primary) hypertension: Secondary | ICD-10-CM | POA: Insufficient documentation

## 2021-09-15 DIAGNOSIS — S022XXA Fracture of nasal bones, initial encounter for closed fracture: Secondary | ICD-10-CM | POA: Insufficient documentation

## 2021-09-15 DIAGNOSIS — F17210 Nicotine dependence, cigarettes, uncomplicated: Secondary | ICD-10-CM | POA: Insufficient documentation

## 2021-09-15 DIAGNOSIS — S0083XA Contusion of other part of head, initial encounter: Secondary | ICD-10-CM
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
## 2021-09-15 NOTE — ED Notes (Signed)
Pt. Discharged prior to RN assessment. 
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
## 2021-09-15 NOTE — ED Notes (Signed)
Pt given ice pack for injuries to face

## 2021-10-01 ENCOUNTER — Encounter
Admission: RE | Disposition: A | Discharge status: Home / Self Care | Payer: Self-pay | Source: Home / Self Care | Attending: Otolaryngology

## 2021-10-01 ENCOUNTER — Other Ambulatory Visit: Payer: Self-pay

## 2021-10-01 ENCOUNTER — Ambulatory Visit: Payer: Self-pay | Admitting: Certified Registered"

## 2021-10-01 ENCOUNTER — Encounter: Payer: Self-pay | Admitting: Otolaryngology

## 2021-10-01 ENCOUNTER — Ambulatory Visit
Admission: RE | Admit: 2021-10-01 | Discharge: 2021-10-01 | Disposition: A | Discharge status: Home / Self Care | Payer: Self-pay | Attending: Otolaryngology | Admitting: Otolaryngology

## 2021-10-01 DIAGNOSIS — S0240DA Maxillary fracture, left side, initial encounter for closed fracture: Secondary | ICD-10-CM | POA: Insufficient documentation

## 2021-10-01 DIAGNOSIS — I10 Essential (primary) hypertension: Secondary | ICD-10-CM | POA: Insufficient documentation

## 2021-10-01 DIAGNOSIS — J342 Deviated nasal septum: Secondary | ICD-10-CM | POA: Insufficient documentation

## 2021-10-01 DIAGNOSIS — F17200 Nicotine dependence, unspecified, uncomplicated: Secondary | ICD-10-CM | POA: Insufficient documentation

## 2021-10-01 DIAGNOSIS — S022XXA Fracture of nasal bones, initial encounter for closed fracture: Secondary | ICD-10-CM | POA: Insufficient documentation

## 2021-10-01 HISTORY — PX: CLOSED REDUCTION NASAL FRACTURE: SHX5365

## 2021-10-01 SURGERY — CLOSED REDUCTION, FRACTURE, NASAL BONE
Anesthesia: General

## 2021-10-01 MED ORDER — HYDROCODONE-ACETAMINOPHEN 5-325 MG PO TABS
2.0000 | ORAL_TABLET | Freq: Four times a day (QID) | ORAL | Status: DC | PRN
  Administered 2021-10-01: 2 via ORAL

## 2021-10-01 MED ORDER — MIDAZOLAM HCL 2 MG/2ML IJ SOLN
INTRAMUSCULAR | Status: AC
  Filled 2021-10-01: qty 2

## 2021-10-01 MED ORDER — PROPOFOL 10 MG/ML IV BOLUS
INTRAVENOUS | Status: DC | PRN
  Administered 2021-10-01: 50 mg via INTRAVENOUS
  Administered 2021-10-01: 200 mg via INTRAVENOUS

## 2021-10-01 MED ORDER — LIDOCAINE-EPINEPHRINE (PF) 1 %-1:200000 IJ SOLN
INTRAMUSCULAR | Status: AC
  Filled 2021-10-01: qty 10

## 2021-10-01 MED ORDER — OXYMETAZOLINE HCL 0.05 % NA SOLN
NASAL | Status: AC
  Filled 2021-10-01: qty 30

## 2021-10-01 MED ORDER — HYDROCODONE-ACETAMINOPHEN 5-325 MG PO TABS: 1.0000 | ORAL_TABLET | Freq: Four times a day (QID) | ORAL | 0 refills | Status: DC | PRN

## 2021-10-01 MED ORDER — PROPOFOL 500 MG/50ML IV EMUL
INTRAVENOUS | Status: AC
  Filled 2021-10-01: qty 50

## 2021-10-01 MED ORDER — FENTANYL CITRATE (PF) 100 MCG/2ML IJ SOLN
INTRAMUSCULAR | Status: DC | PRN
  Administered 2021-10-01: 100 ug via INTRAVENOUS

## 2021-10-01 MED ORDER — FENTANYL CITRATE (PF) 100 MCG/2ML IJ SOLN
INTRAMUSCULAR | Status: AC
  Filled 2021-10-01: qty 2

## 2021-10-01 MED ORDER — FENTANYL CITRATE (PF) 100 MCG/2ML IJ SOLN
25.0000 ug | INTRAMUSCULAR | Status: DC | PRN
  Administered 2021-10-01 (×3): 25 ug via INTRAVENOUS

## 2021-10-01 MED ORDER — HYDROCODONE-ACETAMINOPHEN 5-325 MG PO TABS
ORAL_TABLET | ORAL | Status: AC
  Filled 2021-10-01: qty 2

## 2021-10-01 MED ORDER — LACTATED RINGERS IV SOLN: INTRAVENOUS | Status: DC

## 2021-10-01 MED ORDER — LABETALOL HCL 5 MG/ML IV SOLN
INTRAVENOUS | Status: AC
  Administered 2021-10-01: 5 mg via INTRAVENOUS
  Filled 2021-10-01: qty 4

## 2021-10-01 MED ORDER — CHLORHEXIDINE GLUCONATE 0.12 % MT SOLN: 15.0000 mL | Freq: Once | OROMUCOSAL | Status: DC

## 2021-10-01 MED ORDER — LIDOCAINE HCL (CARDIAC) PF 100 MG/5ML IV SOSY
PREFILLED_SYRINGE | INTRAVENOUS | Status: DC | PRN
  Administered 2021-10-01: 100 mg via INTRAVENOUS

## 2021-10-01 MED ORDER — DEXAMETHASONE SODIUM PHOSPHATE 10 MG/ML IJ SOLN
INTRAMUSCULAR | Status: AC
  Filled 2021-10-01: qty 1

## 2021-10-01 MED ORDER — LABETALOL HCL 5 MG/ML IV SOLN
5.0000 mg | Freq: Once | INTRAVENOUS | Status: AC
Start: 2021-10-01 — End: 2021-10-01

## 2021-10-01 MED ORDER — ONDANSETRON HCL 4 MG/2ML IJ SOLN
INTRAMUSCULAR | Status: DC | PRN
Start: 2021-10-01 — End: 2021-10-01
  Administered 2021-10-01: 4 mg via INTRAVENOUS

## 2021-10-01 MED ORDER — LIDOCAINE HCL 1 % IJ SOLN
INTRAMUSCULAR | Status: DC | PRN
  Administered 2021-10-01: 1 mL

## 2021-10-01 MED ORDER — OXYMETAZOLINE HCL 0.05 % NA SOLN
NASAL | Status: DC | PRN
  Administered 2021-10-01: 1

## 2021-10-01 MED ORDER — FENTANYL CITRATE (PF) 100 MCG/2ML IJ SOLN
INTRAMUSCULAR | Status: AC
  Administered 2021-10-01: 25 ug via INTRAVENOUS
  Filled 2021-10-01: qty 2

## 2021-10-01 MED ORDER — ONDANSETRON HCL 4 MG/2ML IJ SOLN: 4.0000 mg | Freq: Once | INTRAMUSCULAR | Status: DC | PRN

## 2021-10-01 MED ORDER — DEXMEDETOMIDINE (PRECEDEX) IN NS 20 MCG/5ML (4 MCG/ML) IV SYRINGE
PREFILLED_SYRINGE | INTRAVENOUS | Status: DC | PRN
  Administered 2021-10-01: 8 ug via INTRAVENOUS

## 2021-10-01 MED ORDER — MIDAZOLAM HCL 2 MG/2ML IJ SOLN
INTRAMUSCULAR | Status: DC | PRN
  Administered 2021-10-01: 2 mg via INTRAVENOUS

## 2021-10-01 MED ORDER — ORAL CARE MOUTH RINSE: 15.0000 mL | Freq: Once | OROMUCOSAL | Status: DC

## 2021-10-01 SURGICAL SUPPLY — 17 items
ADHESIVE MASTISOL STRL (MISCELLANEOUS) ×2 IMPLANT
CNTNR SPEC 2.5X3XGRAD LEK (MISCELLANEOUS) ×1
CONT SPEC 4OZ STER OR WHT (MISCELLANEOUS) ×1
CONTAINER SPEC 2.5X3XGRAD LEK (MISCELLANEOUS) ×1 IMPLANT
GLOVE SURG ENC MOIS LTX SZ7.5 (GLOVE) ×2 IMPLANT
GOWN STRL REUS W/ TWL LRG LVL3 (GOWN DISPOSABLE) ×2 IMPLANT
GOWN STRL REUS W/TWL LRG LVL3 (GOWN DISPOSABLE) ×2
MANIFOLD NEPTUNE II (INSTRUMENTS) ×2 IMPLANT
NDL HYPO 27GX1-1/4 (NEEDLE) IMPLANT
NEEDLE HYPO 27GX1-1/4 (NEEDLE) ×2 IMPLANT
PATTIES SURGICAL .5 X3 (DISPOSABLE) ×2 IMPLANT
STRAP SAFETY 5IN WIDE (MISCELLANEOUS) ×2 IMPLANT
STRIP CLOSURE SKIN 1/2X4 (GAUZE/BANDAGES/DRESSINGS) ×2 IMPLANT
SYR 3ML LL SCALE MARK (SYRINGE) ×1 IMPLANT
TOWEL OR 17X26 4PK STRL BLUE (TOWEL DISPOSABLE) ×4 IMPLANT
TUBING CONNECTING 10 (TUBING) ×2 IMPLANT
WATER STERILE IRR 500ML POUR (IV SOLUTION) ×2 IMPLANT

## 2021-10-01 NOTE — H&P (Signed)
History and physical reviewed and will be scanned in later. No change in medical status reported by the patient or family, appears stable for surgery. All questions regarding the procedure answered, and patient (or family if a child) expressed understanding of the procedure. ? ?Kurt Dean S [name] ?@TODAY@ ?

## 2021-10-01 NOTE — Anesthesia Postprocedure Evaluation (Signed)
Anesthesia Post Note  Patient: Kurt Dean  Procedure(s) Performed: CLOSED REDUCTION NASAL FRACTURE  Patient location during evaluation: PACU Anesthesia Type: General Level of consciousness: awake and awake and alert Pain management: satisfactory to patient Vital Signs Assessment: post-procedure vital signs reviewed and stable Respiratory status: spontaneous breathing and nonlabored ventilation Cardiovascular status: stable Anesthetic complications: no   No notable events documented.   Last Vitals:  Vitals:   10/01/21 0803 10/01/21 0811  BP:    Pulse: 89 91  Resp: 16 14  Temp:    SpO2: 95% 100%    Last Pain:  Vitals:   10/01/21 0811  TempSrc:   PainSc: 10-Worst pain ever                 VAN STAVEREN,Valyncia Wiens

## 2021-10-01 NOTE — Anesthesia Preprocedure Evaluation (Signed)
Anesthesia Evaluation  Patient identified by MRN, date of birth, ID band Patient awake    Reviewed: Allergy & Precautions, NPO status , Patient's Chart, lab work & pertinent test results  Airway Mallampati: II  TM Distance: >3 FB Neck ROM: full    Dental  (+) Teeth Intact   Pulmonary neg pulmonary ROS, Current Smoker,    Pulmonary exam normal  + decreased breath sounds      Cardiovascular Exercise Tolerance: Good hypertension, negative cardio ROS Normal cardiovascular exam+ dysrhythmias  Rhythm:Regular Rate:Tachycardia     Neuro/Psych negative neurological ROS  negative psych ROS   GI/Hepatic negative GI ROS, Neg liver ROS,   Endo/Other  negative endocrine ROS  Renal/GU negative Renal ROS  negative genitourinary   Musculoskeletal negative musculoskeletal ROS (+)   Abdominal Normal abdominal exam  (+)   Peds negative pediatric ROS (+)  Hematology negative hematology ROS (+)   Anesthesia Other Findings Past Medical History: No date: Hypertension No date: Panic attacks  History reviewed. No pertinent surgical history.  BMI    Body Mass Index: 21.41 kg/m      Reproductive/Obstetrics negative OB ROS                             Anesthesia Physical Anesthesia Plan  ASA: 3  Anesthesia Plan: General   Post-op Pain Management:    Induction: Intravenous  PONV Risk Score and Plan: Dexamethasone, Ondansetron, Midazolam and Treatment may vary due to age or medical condition  Airway Management Planned: LMA  Additional Equipment:   Intra-op Plan:   Post-operative Plan: Extubation in OR  Informed Consent: I have reviewed the patients History and Physical, chart, labs and discussed the procedure including the risks, benefits and alternatives for the proposed anesthesia with the patient or authorized representative who has indicated his/her understanding and acceptance.     Dental  Advisory Given  Plan Discussed with: CRNA and Surgeon  Anesthesia Plan Comments:         Anesthesia Quick Evaluation

## 2021-10-01 NOTE — Transfer of Care (Signed)
Immediate Anesthesia Transfer of Care Note  Patient: Kurt Dean  Procedure(s) Performed: CLOSED REDUCTION NASAL FRACTURE  Patient Location: PACU  Anesthesia Type:General  Level of Consciousness: drowsy  Airway & Oxygen Therapy: Patient Spontanous Breathing  Post-op Assessment: Report given to RN and Post -op Vital signs reviewed and stable  Post vital signs: Reviewed and stable  Last Vitals:  Vitals Value Taken Time  BP 125/87 10/01/21 0749  Temp    Pulse 104 10/01/21 0750  Resp 13 10/01/21 0750  SpO2 93 % 10/01/21 0750  Vitals shown include unvalidated device data.  Last Pain:  Vitals:   10/01/21 0614  TempSrc: Temporal  PainSc: 8          Complications: No notable events documented.

## 2021-10-01 NOTE — Discharge Instructions (Signed)
AMBULATORY SURGERY  ?DISCHARGE INSTRUCTIONS ? ? ?The drugs that you were given will stay in your system until tomorrow so for the next 24 hours you should not: ? ?Drive an automobile ?Make any legal decisions ?Drink any alcoholic beverage ? ? ?You may resume regular meals tomorrow.  Today it is better to start with liquids and gradually work up to solid foods. ? ?You may eat anything you prefer, but it is better to start with liquids, then soup and crackers, and gradually work up to solid foods. ? ? ?Please notify your doctor immediately if you have any unusual bleeding, trouble breathing, redness and pain at the surgery site, drainage, fever, or pain not relieved by medication. ? ? ? ?Additional Instructions: ? ? ? ?Please contact your physician with any problems or Same Day Surgery at [phone], Monday through Friday 6 am to 4 pm, or [facility] [place] Main number at [phone].  ?

## 2021-10-01 NOTE — Op Note (Signed)
10/01/2021 7:45 AM  Kurt Dean [id number]   Pre-Op Dx: NASAL FRACTURE  Post-op Dx: SAME  Procedure: Closed reduction of nasal fracture   Surgeon: Riley Nearing., MD  Anesthesia: General Endotracheal   EBL: Minimal   Complications: None   Findings: Nasal dorsum deviated to the right with bilateral depressed nasal bone fracture   Procedure: After the patient was identified in holding and the history and physical and consent was reviewed, the patient was taken to the operating room and placed in a supine position. General endotracheal anesthesia was induced in the normal fashion. The patient was draped with the eyes protected. The nose was decongested with Afrin moistened pledgets.  1% lidocaine with epinephrine 1 100,000 was injected along either side of the dorsum.  After allowing time for decongestion, the pledgets were removed.   A Boies elevator was then placed intranasally, and used to manipulate the nasal bone fragments until the nasal dorsum appeared to be midline with no palpable step-off deformity.  Following this, the skin was prepped with Mastisol, and 1/2 Ster-strips applied to the nose. Next an Aquaplast splint was fashioned to fit the nasal dorsum, and placed for protection of the nasal bones during healing. This was further secured with Steri-strips. The care of patient was returned to anesthesia, awakened, and transferred to recovery in stable condition.   Disposition: PACU to home   Plan: Regular diet. Ice pack to nose as needed for pain and swelling. Keep nasal splint in place until follow-up. Limit exercise and strenuous activity for the next week. Recheck my office once week.   Riley Nearing., MD  7:45 AM 10/01/2021

## 2021-10-01 NOTE — Anesthesia Procedure Notes (Signed)
Procedure Name: LMA Insertion Date/Time: 10/01/2021 7:31 AM Performed by: Biagio Borg, CRNA Pre-anesthesia Checklist: Patient identified, Emergency Drugs available, Suction available and Patient being monitored Patient Re-evaluated:Patient Re-evaluated prior to induction Oxygen Delivery Method: Circle system utilized Preoxygenation: Pre-oxygenation with 100% oxygen Induction Type: IV induction Ventilation: Mask ventilation without difficulty LMA: LMA inserted LMA Size: 4.5 Tube type: Oral Number of attempts: 1 Placement Confirmation: positive ETCO2 and breath sounds checked- equal and bilateral Tube secured with: Tape Dental Injury: Teeth and Oropharynx as per pre-operative assessment

## 2021-10-02 ENCOUNTER — Other Ambulatory Visit: Payer: Self-pay

## 2021-10-02 ENCOUNTER — Encounter: Payer: Self-pay | Admitting: Otolaryngology

## 2022-03-11 ENCOUNTER — Emergency Department: Payer: Self-pay

## 2022-03-11 ENCOUNTER — Other Ambulatory Visit: Payer: Self-pay

## 2022-03-11 ENCOUNTER — Emergency Department
Admission: EM | Admit: 2022-03-11 | Discharge: 2022-03-11 | Disposition: A | Discharge status: Home / Self Care | Payer: Self-pay | Attending: Emergency Medicine | Admitting: Emergency Medicine

## 2022-03-11 DIAGNOSIS — X58XXXA Exposure to other specified factors, initial encounter: Secondary | ICD-10-CM | POA: Insufficient documentation

## 2022-03-11 DIAGNOSIS — I10 Essential (primary) hypertension: Secondary | ICD-10-CM | POA: Insufficient documentation

## 2022-03-11 DIAGNOSIS — Y990 Civilian activity done for income or pay: Secondary | ICD-10-CM | POA: Insufficient documentation

## 2022-03-11 DIAGNOSIS — S62396A Other fracture of fifth metacarpal bone, right hand, initial encounter for closed fracture: Secondary | ICD-10-CM | POA: Insufficient documentation

## 2022-03-11 NOTE — ED Provider Notes (Signed)
[facility] Provider Note    Event Date/Time   First MD Initiated Contact with Patient 03/11/22 [id number]     (approximate)   History   Hand Pain   HPI  Kurt Dean is a 36 y.o. male who presents to the ED from home with a chief complaint of right hand pain x2 to 3 weeks.  Denies trauma or injury.  Denies trauma or injury but does state he "horse plays" a lot.  Patient is right-hand dominant.  Voices no other complaints or injuries.     Past Medical History   Past Medical History:  Diagnosis Date   Hypertension    Panic attacks      Active Problem List  There are no problems to display for this patient.    Past Surgical History   Past Surgical History:  Procedure Laterality Date   CLOSED REDUCTION NASAL FRACTURE N/A 10/01/2021   Procedure: CLOSED REDUCTION NASAL FRACTURE;  Surgeon: Geanie Logan, MD;  Location: [facility] ORS;  Service: ENT;  Laterality: N/A;     Home Medications   Prior to Admission medications   Medication Sig Start Date End Date Taking? Authorizing Provider  HYDROcodone-acetaminophen (NORCO/VICODIN) 5-325 MG tablet Take 1-2 tablets by mouth every 6 (six) hours as needed for moderate pain. 10/01/21   Geanie Logan, MD     Allergies  Patient has no known allergies.   Family History   Family History  Problem Relation Age of Onset   Hypertension Mother    Stroke Mother    Hypertension Father    Cancer Father    Hypertension Sister    Depression Sister    Stroke Maternal Grandmother    Cancer Paternal Grandmother      Physical Exam  Triage Vital Signs: ED Triage Vitals  Enc Vitals Group     BP 03/11/22 0113 134/85     Pulse Rate 03/11/22 0113 80     Resp 03/11/22 0113 16     Temp 03/11/22 0113 97.6 F (36.4 C)     Temp Source 03/11/22 0113 Oral     SpO2 03/11/22 0113 98 %     Weight --      Height --      Head Circumference --      Peak Flow --      Pain Score 03/11/22 0114 8     Pain Loc --       Pain Edu? --      Excl. in GC? --     Updated Vital Signs: BP 134/85   Pulse 80   Temp 97.6 F (36.4 C) (Oral)   Resp 16   SpO2 98%    General: Awake, no distress.  CV:  Good peripheral perfusion.  Resp:  Normal effort.  Abd:  No distention.  Other:  Right hand: Distal metacarpal with slight deformity and pain.  Limited range of motion secondary to pain.  Overlying healed scar above the knuckle.  2+ radial pulse.  Brisk, less than 5-second cap refill.   ED Results / Procedures / Treatments  Labs (all labs ordered are listed, but only abnormal results are displayed) Labs Reviewed - No data to display   EKG  None   RADIOLOGY I have independently visualized and interpreted patient's x-ray as well as noted the radiology interpretation:  Right hand x-ray: comminuted and angulated distal right fifth metacarpal  Official radiology report(s): DG Hand Complete Right  Result Date: 03/11/2022 CLINICAL DATA:  Right  hand pain EXAM: RIGHT HAND - COMPLETE 3+ VIEW COMPARISON:  None Available. FINDINGS: Comminuted and anteriorly angulated fracture of the distal right fifth metacarpal. Moderate soft tissue swelling. IMPRESSION: Comminuted and anteriorly angulated fracture of the distal right fifth metacarpal. Electronically Signed   By: Deatra Robinson M.D.   On: 03/11/2022 01:48     PROCEDURES:  Critical Care performed: No  Procedures   MEDICATIONS ORDERED IN ED: Medications - No data to display   IMPRESSION / MDM / ASSESSMENT AND PLAN / ED COURSE  I reviewed the triage vital signs and the nursing notes.                             36 year old male presenting with right hand pain x2 weeks.  Distal right metacarpal fracture noted on x-ray.  Will place an OCL boxer splint and refer to hand surgery.  Strict return precautions given.  Patient verbalizes understanding and agrees with plan of care.    FINAL CLINICAL IMPRESSION(S) / ED DIAGNOSES   Final diagnoses:  Closed  nondisplaced fracture of other part of fifth metacarpal bone of right hand, initial encounter     Rx / DC Orders   ED Discharge Orders     None        Note:  This document was prepared using Dragon voice recognition software and may include unintentional dictation errors.   Irean Hong, MD 03/11/22 [phone]

## 2022-04-03 ENCOUNTER — Emergency Department: Payer: Self-pay

## 2022-04-03 ENCOUNTER — Other Ambulatory Visit: Payer: Self-pay

## 2022-04-03 DIAGNOSIS — I10 Essential (primary) hypertension: Secondary | ICD-10-CM | POA: Insufficient documentation

## 2022-04-03 DIAGNOSIS — X110XXA Contact with hot water in bath or tub, initial encounter: Secondary | ICD-10-CM | POA: Insufficient documentation

## 2022-04-03 DIAGNOSIS — S62316D Displaced fracture of base of fifth metacarpal bone, right hand, subsequent encounter for fracture with routine healing: Secondary | ICD-10-CM | POA: Insufficient documentation

## 2022-04-03 DIAGNOSIS — Y92002 Bathroom of unspecified non-institutional (private) residence single-family (private) house as the place of occurrence of the external cause: Secondary | ICD-10-CM | POA: Insufficient documentation

## 2022-04-03 NOTE — ED Triage Notes (Signed)
Pt presents to ER c/o right hand/wrist pain.  Pt states he broke his wrist last month, and has not been able to be seen by orthopedic MD for further treatment.  Pt is still wearing same splint from when he was seen here initially.  Pt has been unable to see orthopedic MD d/t financial constraints.  Pt states he hit his right hand on Monday while in shower, and states the pain worsened shortly after.  Pt is A&O x4 at this time in NAD in triage.

## 2022-04-04 ENCOUNTER — Emergency Department: Payer: Self-pay

## 2022-04-04 ENCOUNTER — Emergency Department
Admission: EM | Admit: 2022-04-04 | Discharge: 2022-04-04 | Disposition: A | Discharge status: Home / Self Care | Payer: Self-pay | Attending: Emergency Medicine | Admitting: Emergency Medicine

## 2022-04-04 DIAGNOSIS — M79641 Pain in right hand: Secondary | ICD-10-CM

## 2022-04-04 DIAGNOSIS — S62316D Displaced fracture of base of fifth metacarpal bone, right hand, subsequent encounter for fracture with routine healing: Secondary | ICD-10-CM

## 2022-04-04 MED ORDER — HYDROCODONE-ACETAMINOPHEN 5-325 MG PO TABS
2.0000 | ORAL_TABLET | Freq: Once | ORAL | Status: AC
  Administered 2022-04-04: 2 via ORAL
  Filled 2022-04-04: qty 2

## 2022-04-04 MED ORDER — IBUPROFEN 800 MG PO TABS
800.0000 mg | ORAL_TABLET | Freq: Once | ORAL | Status: AC
Start: 2022-04-04 — End: 2022-04-04
  Administered 2022-04-04: 800 mg via ORAL
  Filled 2022-04-04: qty 1

## 2022-04-04 NOTE — ED Notes (Signed)
Splint applied by EDT - EDP assessed at the bedside.   E-signature pad unavailable - Pt verbalized understanding of D/C information - no additional concerns at this time.

## 2022-04-04 NOTE — ED Notes (Signed)
Ice pack provided to the patient.

## 2022-04-04 NOTE — ED Provider Notes (Signed)
[facility] Provider Note    Event Date/Time   First MD Initiated Contact with Patient 04/04/22 [phone]     (approximate)   History   Wrist Pain   HPI  Kurt Dean is a 36 y.o. male right-hand-dominant with history of hypertension who presents to the emergency department with complaints of right hand pain.  Patient was seen in the emergency department on 03/11/2022 for right hand pain for 2 weeks and was found to have a comminuted, anterior angulated fracture of the distal right fifth metacarpal.  Was placed in a splint.  States he followed up with [facility] but he reports they told him he had to pay $250 to be seen which she did not have because he has no insurance and is homeless.  He has been wearing his splint ever since.  States he hit his hand when getting out of the shower a couple of days ago and has had increasing pain in the hand and wrist since that time.  No other injury.  No numbness, tingling.   History provided by patient.    Past Medical History:  Diagnosis Date   Hypertension    Panic attacks     Past Surgical History:  Procedure Laterality Date   CLOSED REDUCTION NASAL FRACTURE N/A 10/01/2021   Procedure: CLOSED REDUCTION NASAL FRACTURE;  Surgeon: Geanie Logan, MD;  Location: [facility] ORS;  Service: ENT;  Laterality: N/A;    MEDICATIONS:  Prior to Admission medications   Medication Sig Start Date End Date Taking? Authorizing Provider  HYDROcodone-acetaminophen (NORCO/VICODIN) 5-325 MG tablet Take 1-2 tablets by mouth every 6 (six) hours as needed for moderate pain. 10/01/21   Geanie Logan, MD    Physical Exam   Triage Vital Signs: ED Triage Vitals [04/03/22 2335]  Enc Vitals Group     BP (!) 153/111     Pulse Rate 90     Resp 16     Temp 98.4 F (36.9 C)     Temp Source Oral     SpO2 100 %     Weight 145 lb 8.1 oz (66 kg)     Height 5\' 9"  (1.753 m)     Head Circumference      Peak Flow      Pain Score 10     Pain  Loc      Pain Edu?      Excl. in GC?     Most recent vital signs: Vitals:   04/03/22 2335 04/04/22 0458  BP: (!) 153/111 (!) 144/89  Pulse: 90 89  Resp: 16 20  Temp: 98.4 F (36.9 C)   SpO2: 100% 100%     CONSTITUTIONAL: Alert and responds appropriately to questions. Well-appearing; well-nourished HEAD: Normocephalic, atraumatic EYES: Conjunctivae clear, pupils appear equal ENT: normal nose; moist mucous membranes NECK: Normal range of motion CARD: Regular rate and rhythm RESP: Normal chest excursion without splinting or tachypnea; no hypoxia or respiratory distress, speaking full sentences ABD/GI: non-distended EXT: Normal ROM in all joints, no major deformities noted, right ulnar gutter splint in place, when removed patient has some tenderness diffusely throughout the right finger and ulnar aspect of the hand without soft tissue swelling, ecchymosis or deformity.  He has normal capillary refills.  Wrist seems nontender to palpation.  2+ right radial pulse.  Compartments are soft. SKIN: Normal color for age and race, no rashes on exposed skin NEURO: Moves all extremities equally, normal speech, no facial asymmetry noted PSYCH: The  patient's mood and manner are appropriate. Grooming and personal hygiene are appropriate.  ED Results / Procedures / Treatments   LABS: (all labs ordered are listed, but only abnormal results are displayed) Labs Reviewed - No data to display   EKG:   RADIOLOGY: My personal review and interpretation of imaging: Angulated displaced healing fifth metacarpal subacute fracture.  I have personally reviewed all radiology reports. DG Wrist Complete Right  Result Date: 04/04/2022 CLINICAL DATA:  History of fifth metacarpal fracture. Wrist pain after injury. EXAM: RIGHT WRIST - COMPLETE 3+ VIEW COMPARISON:  Hand x-ray 03/11/2022 FINDINGS: Nonacute un healed fracture distal fifth metacarpal again noted. No evidence for an acute bony abnormality in the  bony anatomy of the wrist. IMPRESSION: Nonacute fracture distal fifth metacarpal is seen on prior studies to 03/11/2022. No acute bony abnormality. Electronically Signed   By: Kennith Center M.D.   On: 04/04/2022 05:08   DG Hand Complete Right  Result Date: 04/04/2022 CLINICAL DATA:  Right hand pain. EXAM: RIGHT HAND - COMPLETE 3+ VIEW COMPARISON:  03/11/2022 no substantial interval FINDINGS: Nonacute fracture identified in the neck of the fifth metacarpal with apex posterior angulation. Fracture line visible with bony callus associated. Change in bony alignment or position of fracture fragments. IMPRESSION: Nonacute fracture of the fifth metacarpal neck with apex posterior angulation. Fracture line visible with bony callus associated. Electronically Signed   By: Kennith Center M.D.   On: 04/04/2022 05:03     PROCEDURES:  Critical Care performed: No    Procedures   SPLINT APPLICATION Date/Time: 5:31 AM Authorized by: Baxter Hire Esha Fincher Consent: Verbal consent obtained. Risks and benefits: risks, benefits and alternatives were discussed Consent given by: patient Splint applied by: technician Location details: right arm Splint type: ulnar gutter Supplies used: orthoglass Post-procedure: The splinted body part was neurovascularly unchanged following the procedure. Patient tolerance: Patient tolerated the procedure well with no immediate complications.     IMPRESSION / MDM / ASSESSMENT AND PLAN / ED COURSE  I reviewed the triage vital signs and the nursing notes.   Patient here with recurrent right hand injury after recent diagnosis of a comminuted, angulated fracture of the distal right fifth metacarpal about a month ago.     DIFFERENTIAL DIAGNOSIS (includes but not limited to):   Fracture, dislocation, contusion  Patient's presentation is most consistent with acute complicated illness / injury requiring diagnostic workup.  PLAN: We will obtain x-rays of the right hand and wrist.   We will give pain medication.   MEDICATIONS GIVEN IN ED: Medications  HYDROcodone-acetaminophen (NORCO/VICODIN) 5-325 MG per tablet 2 tablet (2 tablets Oral Given 04/04/22 0426)  ibuprofen (ADVIL) tablet 800 mg (800 mg Oral Given 04/04/22 0426)     ED COURSE: X-rays reviewed/interpreted by myself radiologist and shows nonacute fracture of the fifth metacarpal neck with posterior angulation.  No acute fracture or dislocation.  Given injury occurred 6 weeks ago, we will place him back in a splint for another 2 weeks and then discussed with him that he can remove the splint.  Discussed with him that he will need to follow-up with orthopedic surgery for definitive management.  States that his fiance has already made him an appointment with [facility] clinic on August 1 and states he will be getting insurance on that day as well.  Recommended Tylenol, Motrin over-the-counter for pain.  Recommended rest, elevation.  Patient comfortable with this plan.  Neurovascular intact distally.   CONSULTS: No emergent orthopedic consult needed at this time.  Patient here for chronic injury to the right hand.   OUTSIDE RECORDS REVIEWED: Reviewed patient's last operative note with ENT for nasal fracture in January 2023.     FINAL CLINICAL IMPRESSION(S) / ED DIAGNOSES   Final diagnoses:  Right hand pain  Displaced fracture of base of fifth metacarpal bone, right hand, subsequent encounter for fracture with routine healing     Rx / DC Orders   ED Discharge Orders     None        Note:  This document was prepared using Dragon voice recognition software and may include unintentional dictation errors.   Kanasia Gayman, Layla Maw, DO 04/04/22 [phone]

## 2022-04-04 NOTE — Discharge Instructions (Signed)
You may alternate Tylenol 1000 mg every 6 hours as needed for pain, fever and Ibuprofen 800 mg every 6-8 hours as needed for pain, fever.  Please take Ibuprofen with food.  Do not take more than 4000 mg of Tylenol (acetaminophen) in a 24 hour period.  I recommend wearing your splint for the next 2 weeks.  You need to follow-up with orthopedics for definitive management.   Steps to find a Primary Care Provider (PCP):  Call [phone] or [phone] to access "[place] Find a Doctor Service."  2.  You may also go on the [facility] website at [email or web address]
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
## 2023-03-18 ENCOUNTER — Emergency Department (HOSPITAL_COMMUNITY)
Admission: EM | Admit: 2023-03-18 | Discharge: 2023-03-18 | Disposition: A | Discharge status: Home / Self Care | Payer: No Typology Code available for payment source | Attending: Emergency Medicine | Admitting: Emergency Medicine

## 2023-03-18 DIAGNOSIS — Z1152 Encounter for screening for COVID-19: Secondary | ICD-10-CM | POA: Diagnosis not present

## 2023-03-18 DIAGNOSIS — R059 Cough, unspecified: Secondary | ICD-10-CM | POA: Diagnosis present

## 2023-03-18 DIAGNOSIS — H1032 Unspecified acute conjunctivitis, left eye: Secondary | ICD-10-CM | POA: Diagnosis not present

## 2023-03-18 DIAGNOSIS — J069 Acute upper respiratory infection, unspecified: Secondary | ICD-10-CM | POA: Insufficient documentation

## 2023-03-18 LAB — SARS CORONAVIRUS 2 BY RT PCR: SARS Coronavirus 2 by RT PCR: NEGATIVE

## 2023-03-18 LAB — GROUP A STREP BY PCR: Group A Strep by PCR: NOT DETECTED

## 2023-03-18 MED ORDER — AZITHROMYCIN 250 MG PO TABS: 250.0000 mg | ORAL_TABLET | Freq: Every day | ORAL | 0 refills | Status: AC

## 2023-03-18 MED ORDER — ERYTHROMYCIN 5 MG/GM OP OINT: TOPICAL_OINTMENT | OPHTHALMIC | 0 refills | Status: AC

## 2023-03-18 NOTE — ED Provider Notes (Signed)
[facility] AT [facility] Provider Note   CSN: [id number] Arrival date & time: 03/18/23  1407     History  Chief Complaint  Patient presents with   URI    Kurt Dean is a 37 y.o. male no significant past medical history here for evaluation of cough, congestion and rhinorrhea over the last 10 days.  Cough productive of clear sputum.  He is having some sinus pressure bilaterally over his maxillary sinuses as well as some yellow rhinorrhea.  He has been staying at the [facility] so he is unsure if he has had any sick contacts.  No fever, headache, neck pain, diplopia, chest pain, shortness of breath, abdominal pain.  Eating and drinking normally.  Has also had some itching, watering to his left eye.  Denies sensation of foreign body.  No recent trauma or injuries.  No visual changes.  Does not wear glasses or contacts.  HPI     Home Medications Prior to Admission medications   Medication Sig Start Date End Date Taking? Authorizing Provider  azithromycin (ZITHROMAX) 250 MG tablet Take 1 tablet (250 mg total) by mouth daily. Take first 2 tablets together, then 1 every day until finished. 03/18/23  Yes Deysy Schabel A, PA-C  erythromycin ophthalmic ointment Place a 1/2 inch ribbon of ointment into the lower eyelid. 03/18/23  Yes Marteze Vecchio A, PA-C  HYDROcodone-acetaminophen (NORCO/VICODIN) 5-325 MG tablet Take 1-2 tablets by mouth every 6 (six) hours as needed for moderate pain. 10/01/21   Geanie Logan, MD      Allergies    Patient has no known allergies.    Review of Systems   Review of Systems  Constitutional: Negative.   HENT:  Positive for congestion, postnasal drip, rhinorrhea, sinus pressure, sinus pain and sore throat. Negative for drooling, ear discharge, ear pain, facial swelling, mouth sores, nosebleeds, trouble swallowing and voice change.   Eyes:  Positive for discharge, redness and itching. Negative for photophobia and visual  disturbance.  Respiratory:  Positive for cough.   Cardiovascular: Negative.   Gastrointestinal: Negative.   Genitourinary: Negative.   Musculoskeletal: Negative.   Skin: Negative.   Neurological: Negative.   All other systems reviewed and are negative.   Physical Exam Updated Vital Signs BP (!) 139/93 (BP Location: Left Arm)   Pulse 84   Temp 98.2 F (36.8 C) (Oral)   Resp 16   Ht 5\' 9"  (1.753 m)   Wt 65.8 kg   SpO2 100%   BMI 21.41 kg/m  Physical Exam Vitals and nursing note reviewed.  Constitutional:      General: He is not in acute distress.    Appearance: He is well-developed. He is not ill-appearing, toxic-appearing or diaphoretic.  HENT:     Head: Normocephalic and atraumatic.     Nose: Congestion and rhinorrhea present.     Mouth/Throat:     Mouth: Mucous membranes are moist.     Comments: Posterior oropharynx erythematous, uvula midline.  No evidence of tonsillar edema, exudates.  No evidence of PTA or RPA.  No pooling of secretions.  No intraoral lesions. Eyes:     General: Lids are everted, no foreign bodies appreciated. No allergic shiner, visual field deficit or scleral icterus.       Right eye: No foreign body, discharge or hordeolum.        Left eye: Discharge present.No foreign body or hordeolum.     Extraocular Movements: Extraocular movements intact.  Conjunctiva/sclera:     Right eye: Right conjunctiva is not injected. No chemosis, exudate or hemorrhage.    Left eye: No chemosis or hemorrhage.    Pupils: Pupils are equal, round, and reactive to light.     Visual Fields: Right eye visual fields normal and left eye visual fields normal.     Comments: Mild injection left lateral aspect conjunctival region left eye at 3:00.  Negative fluorescein uptake, negative Seidel sign.  Scant yellow drainage and meibomian glands.  PERRLA.  No periorbital erythema, warmth, fluctuance or induration no proptosis or ptosis  Neck:     Comments: No neck stiffness or neck  rigidity, full range of motion without difficulty Cardiovascular:     Rate and Rhythm: Normal rate and regular rhythm.     Pulses: Normal pulses.     Heart sounds: Normal heart sounds.  Pulmonary:     Effort: Pulmonary effort is normal. No respiratory distress.     Breath sounds: Normal breath sounds and air entry.     Comments: Clear bilaterally, speaks in full sentences without difficulty Abdominal:     General: Bowel sounds are normal. There is no distension.     Palpations: Abdomen is soft.     Tenderness: There is no abdominal tenderness. There is no right CVA tenderness, left CVA tenderness or guarding.  Musculoskeletal:        General: No swelling or deformity. Normal range of motion.     Cervical back: Normal range of motion and neck supple.     Right lower leg: No edema.     Left lower leg: No edema.  Skin:    General: Skin is warm and dry.     Capillary Refill: Capillary refill takes less than 2 seconds.     Comments: No rashes, lesions, fluctuance or induration  Neurological:     General: No focal deficit present.     Mental Status: He is alert and oriented to person, place, and time.     ED Results / Procedures / Treatments   Labs (all labs ordered are listed, but only abnormal results are displayed) Labs Reviewed  SARS CORONAVIRUS 2 BY RT PCR  GROUP A STREP BY PCR    EKG None  Radiology No results found.  Procedures Procedures    Medications Ordered in ED Medications - No data to display  ED Course/ Medical Decision Making/ A&P   Patient with congestion, rhinorrhea, cough and sore throat over the last 10 days.  He appears otherwise well.  Heart and lungs clear but abdomen soft, tender.  Posterior pharynx mildly erythematous however no evidence of PTA, RPA.  Uvula is midline.  No pooling of secretions.  No rashes or lesions.  No sick contacts however has been hanging at the [facility] with people during the day.  Suspect he likely had viral syndrome however  symptoms x 10 days we will treat as bacterial given timing.  Also noted he has had yellow, watery drainage to his left eye.  No pain.  PERRLA.  No evidence of periorbital/orbital cellulitis.  No headache, vision changes to suggest acute angle glaucoma.  Has a nonfocal neuroexam without deficits.  Negative Seidel sign, no fluorescein uptake low suspicion for ulcer, corneal abrasion.  Denies chance of foreign object, low suspicion for traumatic injury such as ruptured globe, retained foreign object.  No chemosis, photophobia low suspicion for iritis.  Given matting to lower lid glands will treat for conjunctivitis.  No dendritic staining to suggest  zoster.  No contact or eyeglass use.  Labs personally viewed and interpreted COVID-negative Strep negative  Discussed results with patient.  DC home with symptomatic management.  The patient has been appropriately medically screened and/or stabilized in the ED. I have low suspicion for any other emergent medical condition which would require further screening, evaluation or treatment in the ED or require inpatient management.  Patient is hemodynamically stable and in no acute distress.  Patient able to ambulate in department prior to ED.  Evaluation does not show acute pathology that would require ongoing or additional emergent interventions while in the emergency department or further inpatient treatment.  I have discussed the diagnosis with the patient and answered all questions.  Pain is been managed while in the emergency department and patient has no further complaints prior to discharge.  Patient is comfortable with plan discussed in room and is stable for discharge at this time.  I have discussed strict return precautions for returning to the emergency department.  Patient was encouraged to follow-up with PCP/specialist refer to at discharge.                              Medical Decision Making Amount and/or Complexity of Data Reviewed External Data  Reviewed: labs, radiology and notes. Labs: ordered. Decision-making details documented in ED Course.           Final Clinical Impression(s) / ED Diagnoses Final diagnoses:  Viral upper respiratory tract infection  Acute bacterial conjunctivitis of left eye    Rx / DC Orders ED Discharge Orders          Ordered    azithromycin (ZITHROMAX) 250 MG tablet  Daily        03/18/23 1713    erythromycin ophthalmic ointment        03/18/23 1713              Mehr Depaoli A, PA-C 03/18/23 1732    Lonell Grandchild, MD 03/20/23 1031

## 2023-03-18 NOTE — Discharge Instructions (Addendum)
It was a pleasure taking every here in the emergency department today EMS likely have an upper respiratory infection.  We have treated this with medications.  Take as prescribed.  Return for new or worsening symptoms

## 2023-03-18 NOTE — ED Triage Notes (Signed)
Pt to ED c/o cough, sore throat, left eye pain x 1 week. Has not taken any OTC medications

## 2023-05-29 ENCOUNTER — Emergency Department (HOSPITAL_COMMUNITY): Payer: No Typology Code available for payment source

## 2023-05-29 ENCOUNTER — Emergency Department (HOSPITAL_COMMUNITY)
Admission: EM | Admit: 2023-05-29 | Discharge: 2023-05-29 | Disposition: A | Discharge status: Home / Self Care | Payer: No Typology Code available for payment source

## 2023-05-29 ENCOUNTER — Other Ambulatory Visit: Payer: Self-pay

## 2023-05-29 DIAGNOSIS — S42142A Displaced fracture of glenoid cavity of scapula, left shoulder, initial encounter for closed fracture: Secondary | ICD-10-CM | POA: Diagnosis not present

## 2023-05-29 DIAGNOSIS — M25512 Pain in left shoulder: Secondary | ICD-10-CM | POA: Diagnosis present

## 2023-05-29 DIAGNOSIS — Y92410 Unspecified street and highway as the place of occurrence of the external cause: Secondary | ICD-10-CM | POA: Diagnosis not present

## 2023-05-29 DIAGNOSIS — S0091XA Abrasion of unspecified part of head, initial encounter: Secondary | ICD-10-CM | POA: Diagnosis not present

## 2023-05-29 DIAGNOSIS — M5010 Cervical disc disorder with radiculopathy, unspecified cervical region: Secondary | ICD-10-CM | POA: Diagnosis not present

## 2023-05-29 MED ORDER — OXYCODONE-ACETAMINOPHEN 5-325 MG PO TABS
1.0000 | ORAL_TABLET | Freq: Once | ORAL | Status: AC
  Administered 2023-05-29: 1 via ORAL
  Filled 2023-05-29: qty 1

## 2023-05-29 MED ORDER — OXYCODONE-ACETAMINOPHEN 5-325 MG PO TABS
1.0000 | ORAL_TABLET | Freq: Four times a day (QID) | ORAL | 0 refills | Status: AC | PRN
Start: 2023-05-29 — End: ?

## 2023-05-29 MED ORDER — NAPROXEN 500 MG PO TABS: 500.0000 mg | ORAL_TABLET | Freq: Two times a day (BID) | ORAL | 0 refills | Status: DC

## 2023-05-29 MED ORDER — NAPROXEN 500 MG PO TABS: 500.0000 mg | ORAL_TABLET | Freq: Two times a day (BID) | ORAL | 0 refills | Status: AC

## 2023-05-29 MED ORDER — OXYCODONE-ACETAMINOPHEN 5-325 MG PO TABS: 1.0000 | ORAL_TABLET | Freq: Four times a day (QID) | ORAL | 0 refills | Status: DC | PRN
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
## 2023-05-29 NOTE — Discharge Instructions (Signed)
Your x-rays show a fracture of your shoulder.  Please keep your arm in the shoulder immobilizer until you follow-up with the orthopedic surgeon.  There is also a disc bulge is unclear if this is new since your fall.  Please take the anti-inflammatories as prescribed.  If you are still having pain take the Percocet.  Do not drive or drink alcohol while taking the Percocet.  Please follow-up with the neurosurgeon and orthopedic surgeon for further evaluation.  Return to the ER for weakness, worsening pain, or if you have any issues with your bowels or bladder or difficulty walking as we discussed.

## 2024-03-05 ENCOUNTER — Other Ambulatory Visit: Payer: Self-pay

## 2024-03-05 ENCOUNTER — Encounter (HOSPITAL_COMMUNITY): Payer: Self-pay

## 2024-03-05 ENCOUNTER — Emergency Department (HOSPITAL_COMMUNITY)
Admission: EM | Admit: 2024-03-05 | Discharge: 2024-03-05 | Disposition: A | Discharge status: Home / Self Care | Attending: Emergency Medicine | Admitting: Emergency Medicine

## 2024-03-05 ENCOUNTER — Emergency Department (HOSPITAL_COMMUNITY)

## 2024-03-05 DIAGNOSIS — W228XXA Striking against or struck by other objects, initial encounter: Secondary | ICD-10-CM | POA: Insufficient documentation

## 2024-03-05 DIAGNOSIS — M79674 Pain in right toe(s): Secondary | ICD-10-CM

## 2024-03-05 MED ORDER — IBUPROFEN 400 MG PO TABS
400.0000 mg | ORAL_TABLET | Freq: Once | ORAL | Status: AC
  Administered 2024-03-05: 400 mg via ORAL
  Filled 2024-03-05: qty 1

## 2024-03-05 NOTE — Discharge Instructions (Signed)
 Please take tylenol  500mg  every 4 hours. You make take 1000mg  at a time but DO NOT TAKE more than 4000mg  in a 24hr period. Please take 400mg  Ibuprofen  every 4 hours If symptoms do not resolve in 2 weeks please return to the ED for further evaluation and treatment Thank you for allowing us  to take care of you today.  We hope you begin feeling better soon.   To-Do:  Please follow-up with your primary doctor within the next 2-3 days. Please return to the Emergency Department or call 911 if you experience chest pain, shortness of breath, severe pain, severe fever, altered mental status, or have any reason to think that you need emergency medical care.  Thank you again.  Hope you feel better soon.  [facility] Department of Emergency Medicine

## 2024-03-05 NOTE — ED Provider Notes (Signed)
 [place] EMERGENCY DEPARTMENT AT [facility] Provider Note   CSN: [id number] Arrival date & time: 03/05/24  1751     History Chief Complaint  Patient presents with   Toe Pain    Kurt Dean is a 38 y.o. male w/ no significant PMHx who presents to the ED for evaluation of right great toe pain.  Patient states approximately week and a half ago he stubbed his right great toe.  Patient states he has intermittent pain in his toe which is worse when he walks.  He denies any swelling.  No wounds.  He denies any pain in the remainder of the foot or ankle.  Patient has not taken anything for pain.  He has not iced the foot     Physical Exam Updated Vital Signs BP (!) 133/98   Pulse 85   Temp 98.4 F (36.9 C)   Resp 16   Ht 5' 9 (1.753 m)   Wt 68 kg   SpO2 100%   BMI 22.15 kg/m  Physical Exam Vitals reviewed.  Constitutional:      General: He is not in acute distress.    Appearance: He is not toxic-appearing.  HENT:     Mouth/Throat:     Mouth: Mucous membranes are moist.   Cardiovascular:     Rate and Rhythm: Normal rate.     Pulses: Normal pulses.  Pulmonary:     Effort: Pulmonary effort is normal.   Musculoskeletal:     Cervical back: Normal range of motion.     Comments: Patient has full range of motion of toe and ankle.  No obvious deformities or injuries.  No swelling.  Neurovascular intact.  No localized tenderness.   Skin:    General: Skin is warm and dry.     Capillary Refill: Capillary refill takes less than 2 seconds.   Neurological:     General: No focal deficit present.     Mental Status: He is alert.     ED Results / Procedures / Treatments   Labs (all labs ordered are listed, but only abnormal results are displayed) Labs Reviewed - No data to display  EKG None  Radiology DG Foot Complete Right Result Date: 03/05/2024 CLINICAL DATA:  Toe pain EXAM: RIGHT FOOT COMPLETE - 3+ VIEW COMPARISON:  08/15/2017 FINDINGS: Slightly  prominent interval between the base of the first and second metatarsals measuring between 2 and 3 mm. No acute fracture is seen. The joint spaces are patent IMPRESSION: Slightly prominent interval between the base of the first and second metatarsals, correlate for focal pain/symptoms to the region as subtle Lisfranc injury cannot be excluded. Electronically Signed   By: Luke Bun M.D.   On: 03/05/2024 19:47    Medications Ordered in ED Medications  ibuprofen  (ADVIL ) tablet 400 mg (400 mg Oral Given 03/05/24 2123)    ED Course/ Medical Decision Making/ A&P  Kurt Dean is a 38 y.o. male presents as detailed above  Differential ddx: Fracture, sprain, strain, contusion  On arrival, patient afebrile hemodynamically stable no hypoxia or respiratory distress.  No obvious injuries or deformities.  No tenderness.  No swelling.  ED Work-up: Please see details of labs and imaging listed above. X-ray noted to have slightly prominent interval between base of 1st and 2nd metatarsal.  Patient does not have any tenderness or pain over this area.  This is not the area in which she injured his foot or is having pain.  Low suspicion  for this injury. Advised patient he likely has small strain over great toe.  Advised to continue Tylenol  ibuprofen  and ice.  Advised patient of abnormality seen on x-ray.  Advised if patient still has symptoms in 2 weeks to follow-up in the ED with PCP or with orthopedics.  Patient voiced understanding agrees with plan    Overall impression toe injury Patient stable for discharge and outpatient follow-up. Strict return precautions provided. Patient voices understanding and agrees with plan.   Patient seen with supervising physician who agrees with plan.  Final Clinical Impression(s) / ED Diagnoses Final diagnoses:  Toe pain, right      Waddell Seats, DO PGY-3 Emergency Medicine    Seats Waddell, DO 03/05/24 2229    Freddi Hamilton, MD 03/06/24  [phone]

## 2024-03-05 NOTE — ED Triage Notes (Signed)
 Pt came in via POV d/t an old injury from stubbing his Rt great toe several weeks ago. Pt states that it is aggravated & very irritating when he sometimes has stabbing pains in it & came in to get it checked out. A/Ox4, rates his pain 7/10 during triage.

## 2024-03-05 NOTE — ED Notes (Signed)
 Pt left prior to discharge papers being given or vitals able to be obtained.
